# Patient Record
Sex: Female | Born: 1965
Health system: Southern US, Community
[De-identification: ages and names within clinical notes are randomized; demographics above are authoritative.]

## PROBLEM LIST (undated history)

## (undated) DIAGNOSIS — IMO0002 Reserved for concepts with insufficient information to code with codable children: Secondary | ICD-10-CM

## (undated) DIAGNOSIS — F419 Anxiety disorder, unspecified: Secondary | ICD-10-CM

## (undated) DIAGNOSIS — F17200 Nicotine dependence, unspecified, uncomplicated: Secondary | ICD-10-CM

## (undated) DIAGNOSIS — N6310 Unspecified lump in the right breast, unspecified quadrant: Secondary | ICD-10-CM

## (undated) DIAGNOSIS — R51 Headache: Secondary | ICD-10-CM

## (undated) DIAGNOSIS — A630 Anogenital (venereal) warts: Secondary | ICD-10-CM

## (undated) DIAGNOSIS — D229 Melanocytic nevi, unspecified: Secondary | ICD-10-CM

## (undated) DIAGNOSIS — K219 Gastro-esophageal reflux disease without esophagitis: Secondary | ICD-10-CM

## (undated) HISTORY — DX: Melanocytic nevi, unspecified: D22.9

## (undated) HISTORY — DX: Gastro-esophageal reflux disease without esophagitis: K21.9

## (undated) HISTORY — PX: TONSILLECTOMY: SUR1361

## (undated) HISTORY — DX: Reserved for concepts with insufficient information to code with codable children: IMO0002

## (undated) HISTORY — DX: Headache: R51

## (undated) HISTORY — DX: Anogenital (venereal) warts: A63.0

## (undated) HISTORY — PX: LASER ABLATION CONDYLOMA CERVICAL / VULVAR: SUR819

## (undated) HISTORY — PX: DIAGNOSTIC LAPAROSCOPY: SUR761

---

## 1996-01-18 HISTORY — PX: BREAST LUMPECTOMY: SHX2

## 1996-01-18 HISTORY — PX: BREAST EXCISIONAL BIOPSY: SUR124

## 1997-10-03 ENCOUNTER — Ambulatory Visit (HOSPITAL_COMMUNITY): Admission: RE | Admit: 1997-10-03 | Discharge: 1997-10-03 | Payer: Self-pay | Admitting: Obstetrics and Gynecology

## 1998-11-27 ENCOUNTER — Other Ambulatory Visit: Admission: RE | Admit: 1998-11-27 | Discharge: 1998-11-27 | Payer: Self-pay | Admitting: Obstetrics and Gynecology

## 1999-02-08 ENCOUNTER — Ambulatory Visit (HOSPITAL_COMMUNITY): Admission: RE | Admit: 1999-02-08 | Discharge: 1999-02-08 | Payer: Self-pay | Admitting: Obstetrics and Gynecology

## 1999-02-08 ENCOUNTER — Encounter: Payer: Self-pay | Admitting: Obstetrics and Gynecology

## 1999-02-26 ENCOUNTER — Encounter: Payer: Self-pay | Admitting: Obstetrics and Gynecology

## 1999-02-26 ENCOUNTER — Ambulatory Visit (HOSPITAL_COMMUNITY): Admission: RE | Admit: 1999-02-26 | Discharge: 1999-02-26 | Payer: Self-pay | Admitting: Obstetrics and Gynecology

## 1999-08-04 ENCOUNTER — Encounter: Payer: Self-pay | Admitting: Family Medicine

## 1999-08-04 ENCOUNTER — Encounter: Admission: RE | Admit: 1999-08-04 | Discharge: 1999-08-04 | Payer: Self-pay | Admitting: Family Medicine

## 1999-12-22 ENCOUNTER — Other Ambulatory Visit: Admission: RE | Admit: 1999-12-22 | Discharge: 1999-12-22 | Payer: Self-pay | Admitting: *Deleted

## 2000-01-19 ENCOUNTER — Encounter (INDEPENDENT_AMBULATORY_CARE_PROVIDER_SITE_OTHER): Payer: Self-pay

## 2000-01-19 ENCOUNTER — Ambulatory Visit (HOSPITAL_COMMUNITY): Admission: RE | Admit: 2000-01-19 | Discharge: 2000-01-19 | Payer: Self-pay | Admitting: Obstetrics and Gynecology

## 2000-02-07 ENCOUNTER — Encounter: Payer: Self-pay | Admitting: Family Medicine

## 2000-02-07 ENCOUNTER — Encounter: Admission: RE | Admit: 2000-02-07 | Discharge: 2000-02-07 | Payer: Self-pay | Admitting: Family Medicine

## 2000-02-07 ENCOUNTER — Other Ambulatory Visit: Admission: RE | Admit: 2000-02-07 | Discharge: 2000-02-07 | Payer: Self-pay | Admitting: Family Medicine

## 2000-12-25 ENCOUNTER — Other Ambulatory Visit: Admission: RE | Admit: 2000-12-25 | Discharge: 2000-12-25 | Payer: Self-pay | Admitting: Obstetrics and Gynecology

## 2001-05-14 ENCOUNTER — Encounter: Admission: RE | Admit: 2001-05-14 | Discharge: 2001-05-14 | Payer: Self-pay | Admitting: Family Medicine

## 2001-05-14 ENCOUNTER — Encounter: Payer: Self-pay | Admitting: Family Medicine

## 2002-01-01 ENCOUNTER — Other Ambulatory Visit: Admission: RE | Admit: 2002-01-01 | Discharge: 2002-01-01 | Payer: Self-pay | Admitting: Obstetrics and Gynecology

## 2002-03-08 ENCOUNTER — Encounter: Admission: RE | Admit: 2002-03-08 | Discharge: 2002-03-08 | Payer: Self-pay | Admitting: *Deleted

## 2002-03-08 ENCOUNTER — Encounter: Payer: Self-pay | Admitting: *Deleted

## 2002-03-28 ENCOUNTER — Encounter: Admission: RE | Admit: 2002-03-28 | Discharge: 2002-03-28 | Payer: Self-pay | Admitting: Family Medicine

## 2002-03-28 ENCOUNTER — Encounter: Payer: Self-pay | Admitting: Family Medicine

## 2004-03-16 ENCOUNTER — Encounter: Admission: RE | Admit: 2004-03-16 | Discharge: 2004-03-16 | Payer: Self-pay | Admitting: Obstetrics and Gynecology

## 2005-08-15 ENCOUNTER — Emergency Department (HOSPITAL_COMMUNITY): Admission: EM | Admit: 2005-08-15 | Discharge: 2005-08-15 | Payer: Self-pay | Admitting: Emergency Medicine

## 2005-11-24 ENCOUNTER — Encounter: Admission: RE | Admit: 2005-11-24 | Discharge: 2005-11-24 | Payer: Self-pay | Admitting: Family Medicine

## 2007-05-14 ENCOUNTER — Ambulatory Visit (HOSPITAL_COMMUNITY): Admission: RE | Admit: 2007-05-14 | Discharge: 2007-05-14 | Payer: Self-pay | Admitting: Obstetrics and Gynecology

## 2008-05-14 ENCOUNTER — Ambulatory Visit (HOSPITAL_COMMUNITY): Admission: RE | Admit: 2008-05-14 | Discharge: 2008-05-14 | Payer: Self-pay | Admitting: Obstetrics and Gynecology

## 2009-05-29 ENCOUNTER — Ambulatory Visit (HOSPITAL_COMMUNITY): Admission: RE | Admit: 2009-05-29 | Discharge: 2009-05-29 | Payer: Self-pay | Admitting: Obstetrics and Gynecology

## 2010-05-26 ENCOUNTER — Other Ambulatory Visit (HOSPITAL_COMMUNITY): Payer: Self-pay | Admitting: Obstetrics & Gynecology

## 2010-05-26 DIAGNOSIS — Z1231 Encounter for screening mammogram for malignant neoplasm of breast: Secondary | ICD-10-CM

## 2010-06-04 NOTE — Op Note (Signed)
[facility]  Patient:    Katelyn Hodges, Katelyn Hodges                      MRN: [id number] Proc. Date: 01/19/00 Adm. Date:  [id number] Attending:  Morene Antu                           Operative Report  PREOPERATIVE DIAGNOSIS:       1. Infertility.                               2. Irregular bleeding.  POSTOPERATIVE DIAGNOSIS:      1. Infertility.                               2. Irregular bleeding.                               3. Cornual obstruction with pelvic adhesions.  OPERATION:                    1. Dilatation and curettage hysteroscopy.                               2. Diagnostic laparoscopy with chromopertubation                                  and lysis of adhesions.  SURGEON:                      Sherry A. Rosalio Macadamia, M.D.  ANESTHESIA:                   General.  INDICATIONS:                   This is a 45 year old, G0, P0 woman who has had 12 years of infertility.  The patient has had irregular periods, lasting between 33 and 63 days, mostly with last menses greater than 50 days.  For the last four to five days, she has experienced brown discharge intermittently. The patient also has irregular bleeding between her menses.  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  Because of her infertility history, the patient is brought to the operating room for diagnostic laparoscopy.  Because of her irregular bleeding, A D&C hysteroscopy will be performed as well.  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  DESCRIPTION OF PROCEDURE:     The patient was brought into the operating room and given adequate general anesthesia.  She was placed in the dorsolithotomy position.  Her abdomen and perineum were washed with Betadine.  The vagina was washed with  Betadine.  Foley catheter was inserted into the bladder.  The patient was draped in a sterile fashion.  Speculum was placed within the vagina.  Cervix was grasped with a single-tooth tenaculum.  The cervix was sounded.  The cervix was dilated with Shawnie Pons dilators to a #21.  Hysteroscope was introduced into the endometrial cavity.  No abnormalities were found.  The hysteroscope was removed, and sampling sharp curettage was performed with no significant tissue present.  A HUMI catheter was then placed in the endometrial cavity.  The single-tooth tenaculum and speculum were removed from the vagina.  The surgeons gown and gloves were changed.  The patient was continued to be draped in a sterile fashion.  Subumbilical incision was made.  The fascia was identified.  It was grasped with Kocher clamps.  It was incised horizontally.  The edges of the incision were sutured with 0 Vicryl suture x 2.  The peritoneal cavity was then identified.  It was opened bluntly.  The Hasson trocar was introduced into the peritoneal cavity, and the sutures were placed on it for tension and to create an air block.  The laparoscope was then introduced into the abdominal cavity. Pictures were obtained from the pelvis.  A left suprapubic incision was made and, under direct visualization, suprapubic trocar was placed.  The pelvis was inspected with the above findings.  After pictures were obtained, YAG laser was introduced into the pelvis.  Using a scalpel tip at 15 watts power, all the adhesions around the left fallopian tube, ovary, and uterus were dissected free.  Some adhesions were taken off of the ovary and removed in  this fashion. A small hydatid of Morgagni was lasered off of the right fallopian tube. Prior to all the lasering, the uterus was infiltrated with indigo carmine dye mixed in normal saline.  With significant pressure, no filling of the fallopian tubes could be seen.  Venous system was visualized as increasing with indigo carmine dye.  The entire uterus became blanched and then blue with no filling beyond the cornus; therefore, diagnosis of cornual obstruction was made.  The surgery around the adhesions around the left ovary was performed top minimize left-sided pain as well as to improve chances for future IVF if patient so chooses.   Pictures were obtained of the appendix and the upper abdomen.  Once all pictures were finished, adequate hemostasis was present. There was no endometriosis seen, and no further surgery was performed.  All carbon dioxide was allowed to escape.  The Hasson trocar was removed from the abdominal cavity.  The left suprapubic trocar was removed after all carbon dioxide had escaped.  The subumbilical incision was then closed with the 0 Vicryl stay sutures that had been placed.  This was closed with figure-of-eight interrupted stitches.  Adequate hemostasis was present.  The subumbilical incision was closed with a 4-0 Vicryl in subcutaneous interrupted stitches.  Skin incisions were closed with Dermabond glue.  The HUMI was removed from the cervix and uterus.  The Foley catheter was removed from the bladder.  The patient was taken out of the dorsolithotomy position.  She was awakened; she was extubated; she was removed from the operating room to a stretcher in stable condition.  Complications were none.  Estimated blood loss less than 5 cc. DD:  01/19/00 TD:  01/19/00 Job: [id number]

## 2010-06-10 ENCOUNTER — Encounter: Payer: Self-pay | Admitting: Internal Medicine

## 2010-06-10 ENCOUNTER — Ambulatory Visit (INDEPENDENT_AMBULATORY_CARE_PROVIDER_SITE_OTHER): Payer: Self-pay | Admitting: Internal Medicine

## 2010-06-10 DIAGNOSIS — R209 Unspecified disturbances of skin sensation: Secondary | ICD-10-CM

## 2010-06-10 DIAGNOSIS — F17200 Nicotine dependence, unspecified, uncomplicated: Secondary | ICD-10-CM

## 2010-06-10 DIAGNOSIS — R202 Paresthesia of skin: Secondary | ICD-10-CM

## 2010-06-10 DIAGNOSIS — K219 Gastro-esophageal reflux disease without esophagitis: Secondary | ICD-10-CM

## 2010-06-10 DIAGNOSIS — R946 Abnormal results of thyroid function studies: Secondary | ICD-10-CM

## 2010-06-10 DIAGNOSIS — E162 Hypoglycemia, unspecified: Secondary | ICD-10-CM

## 2010-06-10 DIAGNOSIS — E161 Other hypoglycemia: Secondary | ICD-10-CM

## 2010-06-10 MED ORDER — OMEPRAZOLE 40 MG PO CPDR: 40.0000 mg | DELAYED_RELEASE_CAPSULE | Freq: Every day | ORAL | Status: DC

## 2010-06-15 ENCOUNTER — Ambulatory Visit (HOSPITAL_COMMUNITY): Payer: Self-pay

## 2010-06-16 ENCOUNTER — Ambulatory Visit (HOSPITAL_COMMUNITY)
Admission: RE | Admit: 2010-06-16 | Discharge: 2010-06-16 | Disposition: A | Discharge status: Home / Self Care | Payer: Self-pay | Source: Ambulatory Visit | Attending: Obstetrics & Gynecology | Admitting: Obstetrics & Gynecology

## 2010-06-16 ENCOUNTER — Encounter: Payer: Self-pay | Admitting: Physician Assistant

## 2010-06-16 DIAGNOSIS — Z1231 Encounter for screening mammogram for malignant neoplasm of breast: Secondary | ICD-10-CM

## 2010-06-20 ENCOUNTER — Encounter: Payer: Self-pay | Admitting: Internal Medicine

## 2010-06-20 DIAGNOSIS — K219 Gastro-esophageal reflux disease without esophagitis: Secondary | ICD-10-CM | POA: Insufficient documentation

## 2010-06-20 DIAGNOSIS — F17200 Nicotine dependence, unspecified, uncomplicated: Secondary | ICD-10-CM | POA: Insufficient documentation

## 2010-06-20 DIAGNOSIS — R7989 Other specified abnormal findings of blood chemistry: Secondary | ICD-10-CM | POA: Insufficient documentation

## 2010-06-20 DIAGNOSIS — E161 Other hypoglycemia: Secondary | ICD-10-CM | POA: Insufficient documentation

## 2010-06-20 DIAGNOSIS — R202 Paresthesia of skin: Secondary | ICD-10-CM | POA: Insufficient documentation

## 2010-06-20 NOTE — Assessment & Plan Note (Signed)
Consolation of symptoms not entirely consistent with hyperthyroidism. T4 normal with a low TSH. Recommend repeating TSH and free T4 in approximately 3 weeks. May represent subclinical hyperthyroidism

## 2010-06-20 NOTE — Assessment & Plan Note (Signed)
Obtain B12 level

## 2010-06-20 NOTE — Assessment & Plan Note (Signed)
Discussed pathophysiology involved. Decrease sugar and carbohydrate intake and increase protein intake. No current evidence for diabetes mellitus

## 2010-06-20 NOTE — Assessment & Plan Note (Signed)
Begin omeprazole 40 mg daily.Followup if no improvement or worsening.

## 2010-06-20 NOTE — Progress Notes (Signed)
  Subjective:    Patient ID: Katelyn Hodges, female    DOB: 10-10-1965, 45 y.o.   MRN: [id number]  HPI patient presents to clinic to establish care and for evaluation of multiple medical problems. Has intermittent dizziness recently improved. Notes improvement with p.o. Intake. Blood sugars have been normal. Atypical dull persistent chest discomfort also sharp chest discomfort lasting seconds only associated with belching. Took Zantac with increasing belching. Recently presented to the urgent care and states declined hospitalization for chest pain. Was provided Xanax prescription which she did not take. Has complaints of bilateral leg tingling below the level of the knee. No back pain or radicular symptoms. Brings in outside labs for review including CBC Chem-7 liver function tests fasting lipid profile ALT normal. TSH low at 0.286 however T4 and T3 uptake normal. Smokes tobacco and regular basis but is attempting Chantix samples.  Reviewed past medical history, past surgical history, medications, allergies, social history and family history    Review of Systems  Constitutional: Negative for fever, chills, fatigue and unexpected weight change.  Respiratory: Positive for chest tightness. Negative for cough and shortness of breath.   Cardiovascular: Positive for chest pain. Negative for palpitations.  Gastrointestinal: Negative for abdominal pain and blood in stool.  Neurological: Positive for dizziness and numbness. Negative for weakness.  [all other systems reviewed and are negative        Objective:   Physical Exam    Physical Exam  Vitals reviewed. Constitutional:  appears well-developed and well-nourished. No distress.  HENT:  Head: Normocephalic and atraumatic.  Right Ear: Tympanic membrane, external ear and ear canal normal.  Left Ear: Tympanic membrane, external ear and ear canal normal.  Nose: Nose normal.  Mouth/Throat: Oropharynx is clear and moist. No oropharyngeal exudate.    Eyes: Conjunctivae and EOM are normal. Pupils are equal, round, and reactive to light. Right eye exhibits no discharge. Left eye exhibits no discharge. No scleral icterus.  Neck: Neck supple. No thyromegaly present.  Cardiovascular: Normal rate, regular rhythm and normal heart sounds.  Exam reveals no gallop and no friction rub.   No murmur heard. Pulmonary/Chest: Effort normal and breath sounds normal. No respiratory distress.  has no wheezes.  has no rales.  Lymphadenopathy:   no cervical adenopathy.  Neurological:  is alert.  Skin: Skin is warm and dry.  not diaphoretic.  Psychiatric: normal mood and affect.      Assessment & Plan:

## 2010-06-20 NOTE — Assessment & Plan Note (Signed)
Counseled regarding the need for cessation. Patient proceeding with Chantix samples

## 2010-06-30 ENCOUNTER — Other Ambulatory Visit (INDEPENDENT_AMBULATORY_CARE_PROVIDER_SITE_OTHER): Payer: Self-pay

## 2010-06-30 DIAGNOSIS — R209 Unspecified disturbances of skin sensation: Secondary | ICD-10-CM

## 2010-06-30 DIAGNOSIS — R202 Paresthesia of skin: Secondary | ICD-10-CM

## 2010-06-30 DIAGNOSIS — R946 Abnormal results of thyroid function studies: Secondary | ICD-10-CM

## 2010-07-02 ENCOUNTER — Telehealth: Payer: Self-pay

## 2010-07-02 NOTE — Telephone Encounter (Signed)
Message copied by Beverely Low on Fri Jul 02, 2010  5:19 PM ------      Message from: Staci Righter      Created: Fri Jul 02, 2010  5:13 PM       Both thyroid tests nl. B12 low nl. Begin b12 po qd

## 2010-07-02 NOTE — Telephone Encounter (Signed)
Pt notified and verbalized understanding.  Pt wants to know if there is any explanation as to why her thyroid tested 0.02 three weeks ago and she was feeling horrible but tests nl now. Please advise

## 2010-07-04 NOTE — Telephone Encounter (Signed)
As discussed during visit- initial thyroid screening tests may appear abn especially during times of illness. Appropriate path is to repeat with direct measurement as well which was done. Both tests nl.

## 2010-07-05 NOTE — Telephone Encounter (Signed)
Pt.notified

## 2010-07-05 NOTE — Telephone Encounter (Signed)
Left another message for pt to call back.

## 2010-07-05 NOTE — Telephone Encounter (Signed)
Left message for pt to call back  °

## 2010-07-08 ENCOUNTER — Other Ambulatory Visit: Payer: Self-pay

## 2010-07-09 ENCOUNTER — Telehealth: Payer: Self-pay | Admitting: Internal Medicine

## 2010-07-09 NOTE — Telephone Encounter (Signed)
Pt called to check on status of getting copy of her lab results. Pt said that she had req them to be mailed to her home address. Pls mail.

## 2010-07-09 NOTE — Telephone Encounter (Signed)
done

## 2010-11-17 ENCOUNTER — Ambulatory Visit: Payer: Self-pay | Admitting: Internal Medicine

## 2011-05-19 ENCOUNTER — Other Ambulatory Visit (HOSPITAL_COMMUNITY): Payer: Self-pay | Admitting: Obstetrics and Gynecology

## 2011-05-19 DIAGNOSIS — Z1231 Encounter for screening mammogram for malignant neoplasm of breast: Secondary | ICD-10-CM

## 2011-06-20 ENCOUNTER — Ambulatory Visit (HOSPITAL_COMMUNITY)
Admission: RE | Admit: 2011-06-20 | Discharge: 2011-06-20 | Disposition: A | Discharge status: Home / Self Care | Payer: BC Managed Care – PPO | Source: Ambulatory Visit | Attending: Obstetrics and Gynecology | Admitting: Obstetrics and Gynecology

## 2011-06-20 DIAGNOSIS — Z1231 Encounter for screening mammogram for malignant neoplasm of breast: Secondary | ICD-10-CM

## 2011-06-24 ENCOUNTER — Other Ambulatory Visit: Payer: Self-pay | Admitting: Surgery

## 2011-06-27 ENCOUNTER — Other Ambulatory Visit: Payer: Self-pay | Admitting: Obstetrics and Gynecology

## 2011-06-27 DIAGNOSIS — R928 Other abnormal and inconclusive findings on diagnostic imaging of breast: Secondary | ICD-10-CM

## 2011-06-30 ENCOUNTER — Ambulatory Visit
Admission: RE | Admit: 2011-06-30 | Discharge: 2011-06-30 | Disposition: A | Discharge status: Home / Self Care | Payer: BC Managed Care – PPO | Source: Ambulatory Visit | Attending: Obstetrics and Gynecology | Admitting: Obstetrics and Gynecology

## 2011-06-30 ENCOUNTER — Other Ambulatory Visit: Payer: Self-pay | Admitting: Obstetrics and Gynecology

## 2011-06-30 DIAGNOSIS — R928 Other abnormal and inconclusive findings on diagnostic imaging of breast: Secondary | ICD-10-CM

## 2012-05-22 ENCOUNTER — Other Ambulatory Visit: Payer: Self-pay

## 2012-05-22 ENCOUNTER — Other Ambulatory Visit: Payer: Self-pay | Admitting: Obstetrics and Gynecology

## 2012-05-22 DIAGNOSIS — Z1231 Encounter for screening mammogram for malignant neoplasm of breast: Secondary | ICD-10-CM

## 2012-06-27 ENCOUNTER — Ambulatory Visit: Payer: BC Managed Care – PPO

## 2012-06-28 ENCOUNTER — Ambulatory Visit
Admission: RE | Admit: 2012-06-28 | Discharge: 2012-06-28 | Disposition: A | Discharge status: Home / Self Care | Payer: BC Managed Care – PPO | Source: Ambulatory Visit

## 2012-06-28 DIAGNOSIS — Z1231 Encounter for screening mammogram for malignant neoplasm of breast: Secondary | ICD-10-CM

## 2012-11-30 ENCOUNTER — Other Ambulatory Visit: Payer: Self-pay | Admitting: Obstetrics & Gynecology

## 2012-11-30 ENCOUNTER — Other Ambulatory Visit (HOSPITAL_COMMUNITY)
Admission: RE | Admit: 2012-11-30 | Discharge: 2012-11-30 | Disposition: A | Discharge status: Home / Self Care | Payer: BC Managed Care – PPO | Source: Ambulatory Visit | Attending: Obstetrics & Gynecology | Admitting: Obstetrics & Gynecology

## 2012-11-30 DIAGNOSIS — Z01419 Encounter for gynecological examination (general) (routine) without abnormal findings: Secondary | ICD-10-CM | POA: Insufficient documentation

## 2012-11-30 DIAGNOSIS — Z1151 Encounter for screening for human papillomavirus (HPV): Secondary | ICD-10-CM | POA: Insufficient documentation

## 2013-01-02 ENCOUNTER — Inpatient Hospital Stay (HOSPITAL_COMMUNITY)
Admission: AD | Admit: 2013-01-02 | Discharge: 2013-01-02 | Disposition: A | Discharge status: Home / Self Care | Payer: BC Managed Care – PPO | Source: Ambulatory Visit | Attending: Obstetrics & Gynecology | Admitting: Obstetrics & Gynecology

## 2013-01-02 ENCOUNTER — Encounter (HOSPITAL_COMMUNITY): Payer: Self-pay | Admitting: General Practice

## 2013-01-02 DIAGNOSIS — F17200 Nicotine dependence, unspecified, uncomplicated: Secondary | ICD-10-CM | POA: Insufficient documentation

## 2013-01-02 DIAGNOSIS — N926 Irregular menstruation, unspecified: Secondary | ICD-10-CM

## 2013-01-02 DIAGNOSIS — N939 Abnormal uterine and vaginal bleeding, unspecified: Secondary | ICD-10-CM

## 2013-01-02 DIAGNOSIS — N949 Unspecified condition associated with female genital organs and menstrual cycle: Secondary | ICD-10-CM | POA: Insufficient documentation

## 2013-01-02 DIAGNOSIS — R109 Unspecified abdominal pain: Secondary | ICD-10-CM | POA: Insufficient documentation

## 2013-01-02 DIAGNOSIS — N938 Other specified abnormal uterine and vaginal bleeding: Secondary | ICD-10-CM | POA: Insufficient documentation

## 2013-01-02 LAB — POCT PREGNANCY, URINE: Preg Test, Ur: NEGATIVE

## 2013-01-02 LAB — URINALYSIS, ROUTINE W REFLEX MICROSCOPIC
Bilirubin Urine: NEGATIVE
Nitrite: NEGATIVE
Specific Gravity, Urine: 1.01 (ref 1.005–1.030)
Urobilinogen, UA: 0.2 mg/dL (ref 0.0–1.0)

## 2013-01-02 LAB — CBC WITH DIFFERENTIAL/PLATELET
Basophils Relative: 1 % (ref 0–1)
Eosinophils Relative: 4 % (ref 0–5)
HCT: 36.6 % (ref 36.0–46.0)
Hemoglobin: 12.6 g/dL (ref 12.0–15.0)
Lymphs Abs: 2.3 10*3/uL (ref 0.7–4.0)
MCV: 87.4 fL (ref 78.0–100.0)
Monocytes Absolute: 0.5 10*3/uL (ref 0.1–1.0)
Monocytes Relative: 8 % (ref 3–12)
Neutro Abs: 3.1 10*3/uL (ref 1.7–7.7)
Neutrophils Relative %: 50 % (ref 43–77)
Platelets: 306 10*3/uL (ref 150–400)
RBC: 4.19 MIL/uL (ref 3.87–5.11)
RDW: 13.2 % (ref 11.5–15.5)

## 2013-01-02 LAB — URINE MICROSCOPIC-ADD ON

## 2013-01-02 NOTE — MAU Note (Signed)
Patient states she started her period on 12-14. States she usually has a constant heavy flow for about 2 days, today became heavier with blood clots. Having a constant lower abdominal pain when the heavy bleeding started.

## 2013-01-02 NOTE — MAU Provider Note (Signed)
History     CSN: [id number]  Arrival date and time: 01/02/13 1620   First Provider Initiated Contact with Patient 01/02/13 1656      Chief Complaint  Patient presents with  . Vaginal Bleeding  . Abdominal Cramping   HPI Katelyn Hodges is 47 y.o. presents with excessive vaginal bleeding. She used 3 super tampons in 40 minutes.  LMP 12/14. She called Katelyn Hodges today at 3 pm and instructed to come to MAU.   Increase bleeding this afternoon at work with clots associated with lower abdominal pain.  She wasn't sure if she should pick up the medication that she and Katelyn Hodges had discussed.  She has had endometrial bx that she said was neg.  Ultrasound was planned next.   She appears stable at this time.      Past Medical History  Diagnosis Date  . GERD (gastroesophageal reflux disease)   . Ulcer   . Glaucoma   . Genital warts   . [id number](478.2)     Past Surgical History  Procedure Laterality Date  . Tonsillectomy      Family History  Problem Relation Age of Onset  . Cancer Mother     breast  . Heart disease Father   . Hyperlipidemia Father   . Nephritis Sister   . Heart disease Maternal Uncle   . Cancer Maternal Uncle   . Hyperlipidemia Maternal Uncle   . Hypertension Maternal Uncle   . Heart disease Paternal Uncle   . Diabetes Maternal Grandmother   . Heart disease Maternal Grandfather   . Cancer Maternal Grandfather   . Hyperlipidemia Maternal Grandfather   . Hypertension Maternal Grandfather   . Heart disease Paternal Grandfather   . Cancer Paternal Grandfather     History  Substance Use Topics  . Smoking status: Current Every Day Smoker -- 1.00 packs/day    Types: Cigarettes  . Smokeless tobacco: Not on file  . Alcohol Use: Yes    Allergies: No Known Allergies  Prescriptions prior to admission  Medication Sig Dispense Refill  . ibuprofen (ADVIL,MOTRIN) 600 MG tablet Take 600 mg by mouth every 6 (six) hours as needed for cramping.      . Multiple Vitamin  (MULTIVITAMIN WITH MINERALS) TABS tablet Take 1 tablet by mouth daily.      . vitamin B-12 (CYANOCOBALAMIN) 1000 MCG tablet Take 1,000 mcg by mouth daily.        Review of Systems  Constitutional: Negative for fever and chills.  Gastrointestinal: Positive for abdominal pain (intermittent pain). Negative for nausea, vomiting, diarrhea and constipation.  Genitourinary: Negative for dysuria, urgency, frequency and hematuria.       + for vaginal bleeding with clots  Neurological: Negative for headaches.   Physical Exam   Blood pressure 143/80, pulse 89, resp. rate 16, height 5\' 7"  (1.702 m), weight 145 lb 9.6 oz (66.044 kg), last menstrual period 12/30/2012, SpO2 100.00%.  Physical Exam  Constitutional: She is oriented to person, place, and time. She appears well-developed and well-nourished. No distress.  HENT:  Head: Normocephalic.  Neck: Normal range of motion.  Cardiovascular: Normal rate.   Respiratory: Effort normal.  GI: Soft. She exhibits no distension and no mass. There is no tenderness. There is no rebound and no guarding.  Genitourinary: There is no rash, tenderness or lesion on the right labia. There is no rash, tenderness or lesion on the left labia. There is bleeding (small amount of bright red blood with 1 moderate size  clot.. Neg for active bleeding) around the vagina. No tenderness around the vagina. No foreign body around the vagina. No vaginal discharge found.  Neurological: She is alert and oriented to person, place, and time.  Skin: Skin is warm and dry.  Psychiatric: She has a normal mood and affect. Her behavior is normal.   Results for orders placed during the hospital encounter of 01/02/13 (from the past 24 hour(s))  URINALYSIS, ROUTINE W REFLEX MICROSCOPIC     Status: Abnormal   Collection Time    01/02/13  4:48 PM      Result Value Range   Color, Urine YELLOW  YELLOW   APPearance CLEAR  CLEAR   Specific Gravity, Urine 1.010  1.005 - 1.030   pH 5.5  5.0 - 8.0    Glucose, UA NEGATIVE  NEGATIVE mg/dL   Hgb urine dipstick LARGE (*) NEGATIVE   Bilirubin Urine NEGATIVE  NEGATIVE   Ketones, ur NEGATIVE  NEGATIVE mg/dL   Protein, ur NEGATIVE  NEGATIVE mg/dL   Urobilinogen, UA 0.2  0.0 - 1.0 mg/dL   Nitrite NEGATIVE  NEGATIVE   Leukocytes, UA NEGATIVE  NEGATIVE  URINE MICROSCOPIC-ADD ON     Status: None   Collection Time    01/02/13  4:48 PM      Result Value Range   Squamous Epithelial / LPF RARE  RARE   RBC / HPF 0-2  <3 RBC/hpf  POCT PREGNANCY, URINE     Status: None   Collection Time    01/02/13  5:33 PM      Result Value Range   Preg Test, Ur NEGATIVE  NEGATIVE    MAU Course  Procedures  MDM Reported MSE to Katelyn Hodges.  Order given for HGB and pelvic exam.  Instructed to discuss ultrasound with patient--not emergent and can be done in the office.  Discussed with the patient the option of Provera or the Listeda that is at her pharmacy.  She would like to pick the Rx up and begin taking it instead of using a "new: medication".    Care turned over to [name], Katelyn Hodges at 18:10. 1640 Hbg 12.6 Dr Charlotta Hodges notified Assessment and Plan  A:  Heavy vaginal bleeding    P:  Patient will pick Rx up at the pharmacy tonight and begin taking as RX      Follow up with Katelyn Hodges 01/02/2013, 6:08 PM

## 2013-07-22 ENCOUNTER — Other Ambulatory Visit: Payer: Self-pay

## 2013-07-22 DIAGNOSIS — Z1231 Encounter for screening mammogram for malignant neoplasm of breast: Secondary | ICD-10-CM

## 2013-08-23 ENCOUNTER — Ambulatory Visit
Admission: RE | Admit: 2013-08-23 | Discharge: 2013-08-23 | Disposition: A | Discharge status: Home / Self Care | Payer: BC Managed Care – PPO | Source: Ambulatory Visit

## 2013-08-23 DIAGNOSIS — Z1231 Encounter for screening mammogram for malignant neoplasm of breast: Secondary | ICD-10-CM

## 2013-11-11 ENCOUNTER — Other Ambulatory Visit: Payer: Self-pay | Admitting: Obstetrics & Gynecology

## 2013-11-13 LAB — CYTOLOGY - PAP

## 2013-12-05 ENCOUNTER — Encounter (HOSPITAL_COMMUNITY): Payer: Self-pay | Admitting: *Deleted

## 2013-12-05 ENCOUNTER — Emergency Department (HOSPITAL_COMMUNITY)
Admission: EM | Admit: 2013-12-05 | Discharge: 2013-12-05 | Disposition: A | Discharge status: Home / Self Care | Payer: BC Managed Care – PPO | Source: Home / Self Care | Attending: Emergency Medicine | Admitting: Emergency Medicine

## 2013-12-05 DIAGNOSIS — A049 Bacterial intestinal infection, unspecified: Secondary | ICD-10-CM

## 2013-12-05 LAB — POCT I-STAT, CHEM 8
BUN: 7 mg/dL (ref 6–23)
CALCIUM ION: 1.18 mmol/L (ref 1.12–1.23)
CREATININE: 0.7 mg/dL (ref 0.50–1.10)
Chloride: 107 mEq/L (ref 96–112)
Glucose, Bld: 115 mg/dL — ABNORMAL HIGH (ref 70–99)
HCT: 51 % — ABNORMAL HIGH (ref 36.0–46.0)
Hemoglobin: 17.3 g/dL — ABNORMAL HIGH (ref 12.0–15.0)
Potassium: 3.9 mEq/L (ref 3.7–5.3)
Sodium: 139 mEq/L (ref 137–147)
TCO2: 20 mmol/L (ref 0–100)

## 2013-12-05 LAB — CBC WITH DIFFERENTIAL/PLATELET
BASOS PCT: 0 % (ref 0–1)
Basophils Absolute: 0 10*3/uL (ref 0.0–0.1)
Eosinophils Absolute: 0.3 10*3/uL (ref 0.0–0.7)
Eosinophils Relative: 2 % (ref 0–5)
HEMATOCRIT: 45.7 % (ref 36.0–46.0)
HEMOGLOBIN: 15.9 g/dL — AB (ref 12.0–15.0)
LYMPHS ABS: 2.2 10*3/uL (ref 0.7–4.0)
Lymphocytes Relative: 17 % (ref 12–46)
MCH: 30.5 pg (ref 26.0–34.0)
MCHC: 34.8 g/dL (ref 30.0–36.0)
MCV: 87.7 fL (ref 78.0–100.0)
MONO ABS: 0.9 10*3/uL (ref 0.1–1.0)
MONOS PCT: 7 % (ref 3–12)
NEUTROS ABS: 9.6 10*3/uL — AB (ref 1.7–7.7)
Neutrophils Relative %: 74 % (ref 43–77)
Platelets: 312 10*3/uL (ref 150–400)
RBC: 5.21 MIL/uL — ABNORMAL HIGH (ref 3.87–5.11)
RDW: 13.3 % (ref 11.5–15.5)
WBC: 13 10*3/uL — ABNORMAL HIGH (ref 4.0–10.5)

## 2013-12-05 MED ORDER — SODIUM CHLORIDE 0.9 % IV BOLUS (SEPSIS)
1000.0000 mL | Freq: Once | INTRAVENOUS | Status: AC
  Administered 2013-12-05: 1000 mL via INTRAVENOUS

## 2013-12-05 MED ORDER — CIPROFLOXACIN HCL 500 MG PO TABS: 500.0000 mg | ORAL_TABLET | Freq: Once | ORAL | Status: DC

## 2013-12-05 NOTE — ED Notes (Signed)
Patient stated that she was unable to provide a urine specimen. She voided prior to obtaining stool specimen

## 2013-12-05 NOTE — ED Notes (Signed)
Pt  States  She  Ate  A  Rare  Steak   sev  Days  Ago  And  Has experienced      Diarrhea    And  Low  abd cramps  - had  Some  sweating  As   Well  Last  Pm    .      Pt  denys   Any   Nausea  /  Vomiting

## 2013-12-05 NOTE — ED Provider Notes (Signed)
CSN: [id number]     Arrival date & time 12/05/13  3832 History   First MD Initiated Contact with Patient 12/05/13 0827     Chief Complaint  Patient presents with  . Abdominal Cramping   (Consider location/radiation/quality/duration/timing/severity/associated sxs/prior Treatment) HPI         48 year old female presents for evaluation of bloody diarrhea. This initially began 2 days ago, about 2 hours after she ate steak dinner. She notes that the steak was cooked rare and red meat usually gives her diarrhea but other than that, there was nothing abnormal about the meal. 2 hours after the meal she began to have grossly bloody diarrhea. This is been profuse, occurring about every hour. It has slowed down some in the past day but she is still having the grossly bloody diarrhea. She has had some mild associated dizziness. She also admits to some mild nausea without vomiting. She also gets some abdominal cramping that is mild. She denies fever, chills, vomiting. No recent travel or sick contacts. No recent antibiotic use. No history of bloody diarrhea. No history of C. difficile infection. 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  Past Medical History  Diagnosis Date  . GERD (gastroesophageal reflux disease)   . Ulcer   . Glaucoma   . Genital warts   . 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 Tonsillectomy     Family History  Problem Relation Age of Onset  . Cancer Mother     breast  . Heart disease Father   . Hyperlipidemia Father   . Nephritis Sister   . Heart disease Maternal Uncle   . Cancer Maternal Uncle   . Hyperlipidemia Maternal Uncle   . Hypertension Maternal Uncle   . Heart disease Paternal Uncle   . Diabetes Maternal Grandmother   . Heart disease Maternal Grandfather   . Cancer Maternal Grandfather   . Hyperlipidemia Maternal Grandfather   . Hypertension Maternal Grandfather   . Heart disease Paternal Grandfather   . Cancer  Paternal Grandfather    History  Substance Use Topics  . Smoking status: Current Every Day Smoker -- 1.00 packs/day    Types: Cigarettes  . Smokeless tobacco: Not on file  . Alcohol Use: Yes   OB History    No data available     Review of Systems  Gastrointestinal: Positive for nausea, diarrhea and blood in stool. Negative for abdominal pain.  All other systems reviewed and are negative.   Allergies  Review of patient's allergies indicates no known allergies.  Home Medications   Prior to Admission medications   Medication Sig Start Date End Date Taking? Authorizing Provider  ibuprofen (ADVIL,MOTRIN) 600 MG tablet Take 600 mg by mouth every 6 (six) hours as needed for cramping.    Historical Provider, MD  Multiple Vitamin (MULTIVITAMIN WITH MINERALS) TABS tablet Take 1 tablet by mouth daily.    Historical Provider, MD  vitamin B-12 (CYANOCOBALAMIN) 1000 MCG tablet Take 1,000 mcg by mouth daily.    Historical Provider, MD   BP 136/100 mmHg  Temp(Src) 98.6 F (37 C) (Oral)  Resp 20  SpO2 100%  LMP 11/22/2013 Physical Exam  Constitutional: She is oriented to person, place, and time. Vital signs are normal. She appears well-developed and well-nourished. No distress.  HENT:  Head: Normocephalic and atraumatic.  Eyes:  No scleral pallor  Cardiovascular: Regular rhythm, normal heart sounds and normal pulses.  Tachycardia present.  Pulmonary/Chest: Effort normal and breath sounds normal. No respiratory distress.  Abdominal: Soft. Normal appearance. She exhibits no distension, no ascites and no mass. Bowel sounds are decreased. There is no hepatosplenomegaly. There is tenderness (mild) in the left lower quadrant. There is no rigidity, no rebound, no guarding, no CVA tenderness, no tenderness at McBurney's point and negative Murphy's sign. No hernia.  Neurological: She is alert and oriented to person, place, and time. She has normal strength. Coordination normal.  Skin: Skin is  warm and dry. No rash noted. She is not diaphoretic.  Psychiatric: She has a normal mood and affect. Judgment normal.  Nursing note and vitals reviewed.   ED Course  Procedures (including critical care time) Labs Review Labs Reviewed  CBC WITH DIFFERENTIAL - Abnormal; Notable for the following:    WBC 13.0 (*)    RBC 5.21 (*)    Hemoglobin 15.9 (*)    Neutro Abs 9.6 (*)    All other components within normal limits  POCT I-STAT, CHEM 8 - Abnormal; Notable for the following:    Glucose, Bld 115 (*)    Hemoglobin 17.3 (*)    HCT 51.0 (*)    All other components within normal limits  CLOSTRIDIUM DIFFICILE BY PCR  STOOL CULTURE    Imaging Review No results found.  She attempted to provide a stool sample, she was not able to provide enough to send for a culture but what she did provide was grossly bloody  MDM   1. Intestinal infection due to bacteria causing bloody diarrhea    After 1 L of normal saline, the heart rate is down to 80 and she is feeling much better. Her CBC reveals mild leukocytosis, no anemia. She has no renal dysfunction and her electrolytes are all within normal limits. With grossly bloody diarrhea, the presumptive diagnosis is enterohemorrhagic Escherichia coli infection. She has been provided a stool sample kit to bring back so that we may send a stool culture and C. difficile toxin. The plan is for her to return on Saturday for a recheck, to recheck her vitals as well as CBC and i-STAT again. If she worsens in the meantime, she will return for a recheck. No antibiotics are indicated at this time, pending the stool culture results      Liam Graham, PA-C 12/05/13 1009

## 2013-12-05 NOTE — Discharge Instructions (Signed)
Please return the stool sample as soon as possible so that we may send the culture and the C. difficile toxin. Follow up on Saturday morning to be checked again.   Food Choices to Help Relieve Diarrhea When you have diarrhea, the foods you eat and your eating habits are very important. Choosing the right foods and drinks can help relieve diarrhea. Also, because diarrhea can last up to 7 days, you need to replace lost fluids and electrolytes (such as sodium, potassium, and chloride) in order to help prevent dehydration.  WHAT GENERAL GUIDELINES DO I NEED TO FOLLOW?  Slowly drink 1 cup (8 oz) of fluid for each episode of diarrhea. If you are getting enough fluid, your urine will be clear or pale yellow.  Eat starchy foods. Some good choices include white rice, white toast, pasta, low-fiber cereal, baked potatoes (without the skin), saltine crackers, and bagels.  Avoid large servings of any cooked vegetables.  Limit fruit to two servings per day. A serving is  cup or 1 small piece.  Choose foods with less than 2 g of fiber per serving.  Limit fats to less than 8 tsp (38 g) per day.  Avoid fried foods.  Eat foods that have probiotics in them. Probiotics can be found in certain dairy products.  Avoid foods and beverages that may increase the speed at which food moves through the stomach and intestines (gastrointestinal tract). Things to avoid include:  High-fiber foods, such as dried fruit, raw fruits and vegetables, nuts, seeds, and whole grain foods.  Spicy foods and high-fat foods.  Foods and beverages sweetened with high-fructose corn syrup, honey, or sugar alcohols such as xylitol, sorbitol, and mannitol. WHAT FOODS ARE RECOMMENDED? Grains White rice. White, [place], or pita breads (fresh or toasted), including plain rolls, buns, or bagels. White pasta. Saltine, soda, or graham crackers. Pretzels. Low-fiber cereal. Cooked cereals made with water (such as cornmeal, farina, or cream  cereals). Plain muffins. Matzo. Melba toast. Zwieback.  Vegetables Potatoes (without the skin). Strained tomato and vegetable juices. Most well-cooked and canned vegetables without seeds. Tender lettuce. Fruits Cooked or canned applesauce, apricots, cherries, fruit cocktail, grapefruit, peaches, pears, or plums. Fresh bananas, apples without skin, cherries, grapes, cantaloupe, grapefruit, peaches, oranges, or plums.  Meat and Other Protein Products Baked or boiled chicken. Eggs. Tofu. Fish. Seafood. Smooth peanut butter. Ground or well-cooked tender beef, ham, veal, lamb, pork, or poultry.  Dairy Plain yogurt, kefir, and unsweetened liquid yogurt. Lactose-free milk, buttermilk, or soy milk. Plain hard cheese. Beverages Sport drinks. Clear broths. Diluted fruit juices (except prune). Regular, caffeine-free sodas such as ginger ale. Water. Decaffeinated teas. Oral rehydration solutions. Sugar-free beverages not sweetened with sugar alcohols. Other Bouillon, broth, or soups made from recommended foods.  The items listed above may not be a complete list of recommended foods or beverages. Contact your dietitian for more options. WHAT FOODS ARE NOT RECOMMENDED? Grains Whole grain, whole wheat, bran, or rye breads, rolls, pastas, crackers, and cereals. Wild or brown rice. Cereals that contain more than 2 g of fiber per serving. Corn tortillas or taco shells. Cooked or dry oatmeal. Granola. Popcorn. Vegetables Raw vegetables. Cabbage, broccoli, Brussels sprouts, artichokes, baked beans, beet greens, corn, kale, legumes, peas, sweet potatoes, and yams. Potato skins. Cooked spinach and cabbage. Fruits Dried fruit, including raisins and dates. Raw fruits. Stewed or dried prunes. Fresh apples with skin, apricots, mangoes, pears, raspberries, and strawberries.  Meat and Other Protein Products Chunky peanut butter. Nuts and seeds. Beans  and lentils. Berniece Salines.  Dairy High-fat cheeses. Milk, chocolate milk, and  beverages made with milk, such as milk shakes. Cream. Ice cream. Sweets and Desserts Sweet rolls, doughnuts, and sweet breads. Pancakes and waffles. Fats and Oils Butter. Cream sauces. Margarine. Salad oils. Plain salad dressings. Olives. Avocados.  Beverages Caffeinated beverages (such as coffee, tea, soda, or energy drinks). Alcoholic beverages. Fruit juices with pulp. Prune juice. Soft drinks sweetened with high-fructose corn syrup or sugar alcohols. Other Coconut. Hot sauce. Chili powder. Mayonnaise. Gravy. Cream-based or milk-based soups.  The items listed above may not be a complete list of foods and beverages to avoid. Contact your dietitian for more information. WHAT SHOULD I DO IF I BECOME DEHYDRATED? Diarrhea can sometimes lead to dehydration. Signs of dehydration include dark urine and dry mouth and skin. If you think you are dehydrated, you should rehydrate with an oral rehydration solution. These solutions can be purchased at pharmacies, retail stores, or online.  Drink -1 cup (120-240 mL) of oral rehydration solution each time you have an episode of diarrhea. If drinking this amount makes your diarrhea worse, try drinking smaller amounts more often. For example, drink 1-3 tsp (5-15 mL) every 5-10 minutes.  A general rule for staying hydrated is to drink 1-2 L of fluid per day. Talk to your health care provider about the specific amount you should be drinking each day. Drink enough fluids to keep your urine clear or pale yellow. Document Released: 03/26/2003 Document Revised: 01/08/2013 Document Reviewed: 11/26/2012 [facility] Patient Information 2015 [place], [place]. This information is not intended to replace advice given to you by your health care provider. Make sure you discuss any questions you have with your health care provider.

## 2013-12-06 LAB — CLOSTRIDIUM DIFFICILE BY PCR: Toxigenic C. Difficile by PCR: NEGATIVE

## 2013-12-07 NOTE — ED Notes (Signed)
"   I feel better" States she is does not feel a return visit is needed

## 2013-12-09 LAB — STOOL CULTURE

## 2013-12-13 NOTE — ED Notes (Signed)
Call for lab reports .After confirming ID,discussedl ab reports

## 2014-03-17 ENCOUNTER — Ambulatory Visit: Payer: Self-pay | Admitting: Family Medicine

## 2014-03-24 ENCOUNTER — Ambulatory Visit: Payer: Self-pay | Admitting: Family Medicine

## 2014-06-30 ENCOUNTER — Ambulatory Visit: Payer: Self-pay | Admitting: Medical

## 2014-07-02 ENCOUNTER — Ambulatory Visit (INDEPENDENT_AMBULATORY_CARE_PROVIDER_SITE_OTHER): Payer: BLUE CROSS/BLUE SHIELD | Admitting: Medical

## 2014-07-02 ENCOUNTER — Encounter: Payer: Self-pay | Admitting: Medical

## 2014-07-02 VITALS — BP 140/80 | HR 78 | Temp 97.9°F | Resp 16 | Wt 144.0 lb

## 2014-07-02 DIAGNOSIS — F39 Unspecified mood [affective] disorder: Secondary | ICD-10-CM

## 2014-07-02 DIAGNOSIS — R358 Other polyuria: Secondary | ICD-10-CM | POA: Diagnosis not present

## 2014-07-02 DIAGNOSIS — M2560 Stiffness of unspecified joint, not elsewhere classified: Secondary | ICD-10-CM | POA: Diagnosis not present

## 2014-07-02 DIAGNOSIS — Z720 Tobacco use: Secondary | ICD-10-CM | POA: Diagnosis not present

## 2014-07-02 DIAGNOSIS — R5383 Other fatigue: Secondary | ICD-10-CM | POA: Insufficient documentation

## 2014-07-02 DIAGNOSIS — M2550 Pain in unspecified joint: Secondary | ICD-10-CM | POA: Insufficient documentation

## 2014-07-02 DIAGNOSIS — Z8269 Family history of other diseases of the musculoskeletal system and connective tissue: Secondary | ICD-10-CM

## 2014-07-02 DIAGNOSIS — R4586 Emotional lability: Secondary | ICD-10-CM

## 2014-07-02 DIAGNOSIS — F17200 Nicotine dependence, unspecified, uncomplicated: Secondary | ICD-10-CM | POA: Insufficient documentation

## 2014-07-02 DIAGNOSIS — R7989 Other specified abnormal findings of blood chemistry: Secondary | ICD-10-CM

## 2014-07-02 DIAGNOSIS — R946 Abnormal results of thyroid function studies: Secondary | ICD-10-CM

## 2014-07-02 DIAGNOSIS — R3589 Other polyuria: Secondary | ICD-10-CM

## 2014-07-02 LAB — POCT URINALYSIS DIPSTICK
Bilirubin, UA: NEGATIVE
GLUCOSE UA: NEGATIVE
Ketones, UA: NEGATIVE
Leukocytes, UA: NEGATIVE
Nitrite, UA: NEGATIVE
Protein, UA: NEGATIVE
RBC UA: NEGATIVE
Spec Grav, UA: 1.015
UROBILINOGEN UA: NEGATIVE
pH, UA: 6

## 2014-07-02 LAB — COMPREHENSIVE METABOLIC PANEL
ALT: 14 U/L (ref 0–35)
AST: 15 U/L (ref 0–37)
Albumin: 4.4 g/dL (ref 3.5–5.2)
Alkaline Phosphatase: 47 U/L (ref 39–117)
BUN: 11 mg/dL (ref 6–23)
CO2: 25 mEq/L (ref 19–32)
Calcium: 9.6 mg/dL (ref 8.4–10.5)
Chloride: 104 mEq/L (ref 96–112)
Creat: 0.73 mg/dL (ref 0.50–1.10)
Glucose, Bld: 83 mg/dL (ref 70–99)
Potassium: 4.4 mEq/L (ref 3.5–5.3)
SODIUM: 137 meq/L (ref 135–145)
TOTAL PROTEIN: 6.9 g/dL (ref 6.0–8.3)
Total Bilirubin: 0.5 mg/dL (ref 0.2–1.2)

## 2014-07-02 LAB — CBC
HCT: 42.9 % (ref 36.0–46.0)
Hemoglobin: 14.3 g/dL (ref 12.0–15.0)
MCH: 29.6 pg (ref 26.0–34.0)
MCHC: 33.3 g/dL (ref 30.0–36.0)
MCV: 88.8 fL (ref 78.0–100.0)
MPV: 9.9 fL (ref 8.6–12.4)
Platelets: 369 10*3/uL (ref 150–400)
RBC: 4.83 MIL/uL (ref 3.87–5.11)
RDW: 14.4 % (ref 11.5–15.5)
WBC: 9.8 10*3/uL (ref 4.0–10.5)

## 2014-07-02 LAB — T4, FREE: Free T4: 1 ng/dL (ref 0.80–1.80)

## 2014-07-02 LAB — URIC ACID: URIC ACID, SERUM: 4.5 mg/dL (ref 2.4–7.0)

## 2014-07-02 LAB — TSH: TSH: 0.691 u[IU]/mL (ref 0.350–4.500)

## 2014-07-02 LAB — RHEUMATOID FACTOR

## 2014-07-02 NOTE — Progress Notes (Signed)
Subjective: Here as a new patient today.  Has seen urgent care and others for acute issues in the past, but no prior primary care consistently.  Here for numerous concerns.  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  4 years ago started feeling bad in general, mornings would feel sweaty, shaky, would eat something and feel better.  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   Went to internist at that time, was put on b12, and later on thyroid labs normalized   Repeat testing over time was just barely within normal range.   Lately still having the same symptoms, not feeling well in general, fatigued all the time, sweating a lot, sweaty palms, soles, armpits, night sweats.   At times heart rate accelerates.   Taking naps at times, just feels crappy.  Left leg constantly numb and tingling for over a year.  Does drink and smoke.    Aware that she should do better with health, smoking and alcohol.   BPs are borderline at times, have been running higher than usual.  Has continued to feel crappy for the last few years.   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, smokes 1ppd, drinks 4 beers daily.  had recent break up with boyfriend of 4 years.  Divorced 15 years ago.  Has had a few bad relationships in her life.  Lives in the past, dwells on things.  Not attending church.   No family in the area.  Family mostly in [place], [place], [place], [place].  Saw a doctor in the past was on Lithium for about 4-6 months, seemed to do ok on this until she realized lithium was for bipolar and stopped the medication, didn't want to be called bipolar.   No other aggravating or relieving factors. No other complaint.  Review of Systems Constitutional: -fever, -chills, +sweats, -unexpected weight change, +fatigue ENT: -runny nose, -ear pain, -sore throat Cardiology:  -chest pain, +palpitations, -edema Respiratory: -cough, -shortness of breath,  -wheezing Gastroenterology: -abdominal pain, -nausea, -vomiting, -diarrhea, -constipation  Hematology: -bleeding or bruising problems Musculoskeletal: morning stiffness for 15 min, +arthralgias bilat knees, pain in left foot, feet and hands at times may feel somewhat swollen in hot weather, -myalgias, -joint swelling, -back pain Ophthalmology: -vision changes Urology: -dysuria, -difficulty urinating, -hematuria, -urinary frequency, -urgency Neurology: +somewhat often headaches, -weakness, + left left tingling ongoing for years now, numbness in left leg.  Gyn: regular, extremely heavy, LMP 06/23/14 Psych:  +hyperactive, depressed mood at times, no crying spells, irritable, family hx/o depression, does have some mood swings    Objective: BP 140/80 mmHg  Pulse 78  Temp(Src) 97.9 F (36.6 C) (Oral)  Resp 16  Wt 144 lb (65.318 kg)  General appearance: alert, no distress, WD/WN, white female HEENT: normocephalic, sclerae anicteric, PERRLA, EOMi, nares patent, no discharge or erythema, pharynx normal Oral cavity: MMM, no lesions Neck: supple, no lymphadenopathy, no thyromegaly, no masses Heart: RRR, normal S1, S2, no murmurs Lungs: CTA bilaterally, no wheezes, rhonchi, or rales Abdomen: +bs, soft, non tender, non distended, no masses, no hepatomegaly, no splenomegaly Back: non tender Musculoskeletal: nontender, no swelling, no obvious deformity Extremities: no edema, no cyanosis, no clubbing Pulses: 2+ symmetric, upper and lower extremities, normal cap refill Neurological: left lower leg seems to be slightly decreased sensation compared to right, otherwise alert, oriented x 3, CN2-12 intact, strength normal upper extremities and lower extremities, sensation normal throughout, DTRs 2+ throughout, no cerebellar signs,  gait normal   Assessment: Encounter Diagnoses  Name Primary?  . Polyarthralgia Yes  . Morning stiffness of joints   . Family history of rheumatic joint disease   . Polyuria    . Abnormal thyroid blood test   . Other fatigue   . Smoker   . Mood swings     Plan: Discussed her numerous concerns.   I recommended she seek counseling to deal with coping and stress and healing past wrongs.   I invited her to church, discussed small groups and having support to deal with "her past" she is still living in.  General panel of labs today to eval her concerns.   She will return soon for a physical and spirometry.  discussed possible multiple causes of her symptoms .  Advised she significantly cut back on alcohol and tobacco, working on quitting tobacco completley.    Discussed her stress, mood, ways to cope.  F/u pending labs.   Spent > 45 minutes face to face in discussion and evaluation.

## 2014-07-02 NOTE — Patient Instructions (Signed)
[facility] [phone] office [email or web address] [address], [place], [place], [place] [address]  Rema Fendt, therapist  Toy Cookey, MA, clinical psychologist  Cognitive-Behavior Therapy; Mood Disorders; Anxiety Disorders; adult and child ADHD; Family Therapy; Stress Management; personal growth, and Marital Therapy.    Terrance Mass Ph.D., clinical psychologist Cognitive-Behavior Therapy; Mood Disorders; Anxiety Disorders; Stress     Management   [facility] [address] [place], [place] [address] [phone]   [facility], psychotherapist [address] [place], [place] [address] [phone]   I invite you to [facility] in [place], as I think this could be helpful.

## 2014-07-02 NOTE — Addendum Note (Signed)
Addended by: Armanda Magic on: 07/02/2014 03:29 PM   Modules accepted: Orders

## 2014-07-03 LAB — SEDIMENTATION RATE: Sed Rate: 1 mm/hr (ref 0–20)

## 2014-07-03 LAB — CYCLIC CITRUL PEPTIDE ANTIBODY, IGG: CYCLIC CITRULLIN PEPTIDE AB: 2.7 U/mL (ref 0.0–5.0)

## 2014-07-03 LAB — ANA: ANA: NEGATIVE

## 2016-02-17 ENCOUNTER — Other Ambulatory Visit: Payer: Self-pay | Admitting: Obstetrics & Gynecology

## 2016-02-17 DIAGNOSIS — R928 Other abnormal and inconclusive findings on diagnostic imaging of breast: Secondary | ICD-10-CM

## 2016-02-22 ENCOUNTER — Ambulatory Visit
Admission: RE | Admit: 2016-02-22 | Discharge: 2016-02-22 | Disposition: A | Discharge status: Home / Self Care | Payer: 59 | Source: Ambulatory Visit | Attending: Obstetrics & Gynecology | Admitting: Obstetrics & Gynecology

## 2016-02-22 DIAGNOSIS — R928 Other abnormal and inconclusive findings on diagnostic imaging of breast: Secondary | ICD-10-CM

## 2017-10-04 DIAGNOSIS — Z23 Encounter for immunization: Secondary | ICD-10-CM | POA: Diagnosis not present

## 2017-12-21 ENCOUNTER — Encounter: Payer: Self-pay | Admitting: Family Medicine

## 2017-12-21 ENCOUNTER — Ambulatory Visit (INDEPENDENT_AMBULATORY_CARE_PROVIDER_SITE_OTHER): Payer: 59 | Admitting: Family Medicine

## 2017-12-21 VITALS — BP 130/82 | HR 64 | Temp 97.9°F | Ht 67.5 in | Wt 142.8 lb

## 2017-12-21 DIAGNOSIS — F17200 Nicotine dependence, unspecified, uncomplicated: Secondary | ICD-10-CM | POA: Diagnosis not present

## 2017-12-21 DIAGNOSIS — I499 Cardiac arrhythmia, unspecified: Secondary | ICD-10-CM | POA: Insufficient documentation

## 2017-12-21 DIAGNOSIS — R5383 Other fatigue: Secondary | ICD-10-CM | POA: Diagnosis not present

## 2017-12-21 DIAGNOSIS — R0683 Snoring: Secondary | ICD-10-CM | POA: Insufficient documentation

## 2017-12-21 DIAGNOSIS — K219 Gastro-esophageal reflux disease without esophagitis: Secondary | ICD-10-CM | POA: Diagnosis not present

## 2017-12-21 DIAGNOSIS — Z1389 Encounter for screening for other disorder: Secondary | ICD-10-CM

## 2017-12-21 DIAGNOSIS — N959 Unspecified menopausal and perimenopausal disorder: Secondary | ICD-10-CM

## 2017-12-21 DIAGNOSIS — L749 Eccrine sweat disorder, unspecified: Secondary | ICD-10-CM | POA: Insufficient documentation

## 2017-12-21 DIAGNOSIS — R7989 Other specified abnormal findings of blood chemistry: Secondary | ICD-10-CM

## 2017-12-21 DIAGNOSIS — Z8249 Family history of ischemic heart disease and other diseases of the circulatory system: Secondary | ICD-10-CM | POA: Insufficient documentation

## 2017-12-21 DIAGNOSIS — R000 Tachycardia, unspecified: Secondary | ICD-10-CM | POA: Insufficient documentation

## 2017-12-21 DIAGNOSIS — Z7689 Persons encountering health services in other specified circumstances: Secondary | ICD-10-CM

## 2017-12-21 DIAGNOSIS — Z803 Family history of malignant neoplasm of breast: Secondary | ICD-10-CM | POA: Diagnosis not present

## 2017-12-21 NOTE — Patient Instructions (Signed)
Your goal blood pressure should be 130/80 or less on a regular basis, or medications should be started/ modified.    Normal blood pressure is less than 120/80.  --Thus Katelyn Hodges, if your blood pressures running high at home, I want you to follow-up sooner than planned so we can discuss treatment options.  -Also remember if you develop any of those red flag symptoms we discussed, I do not want you to wait until next Friday to see cardiology, if that were the case and you developed any of those SX, you would have to go to the emergency room for urgent evaluation     Please think seriously about quitting smoking!  This is very important for your health and well being.   Please let us know in the future if you are interested and ready to quit.  You can also call 1-800-QUIT-NOW 4695071158) for free smoking cessation counseling and support.     Also, please go online to www.heart.org (the American Heart Association website) and search "quit smoking ".     Or try the centers for disease control website at: https://www.schmidt.com/  Or, go to the "national cancer institute" web site of [employer]:  http://benson.com/  There is a lot of great information on these websites for you to look over.      Want to Quit Smoking? FDA-Approved Products Can Help  Quitting smoking can be hard, but it is possible. In fact, every time you put out a cigarette is a new chance to try quitting again, according to the [place]. Food and Drug Administration's newest tobacco education campaign, "Every Try Counts."   If you want to quit-almost 70 percent of adult smokers say they do-you may want to use a "smoking cessation" product proven to help. Data has shown that using FDA-approved cessation medicine can double your chance of quitting successfully.  Some products contain nicotine as an active ingredient and others do not. These products  include over-the-counter (OTC) options like skin patches, lozenges, and gum, as well as prescription medicines.  Smoking cessation products are intended to help you quit smoking. They are regulated through the [facility], which ensures that the products are safe and effective and that their benefits outweigh any known associated risks.  The Benefits of Quitting Smoking No matter how much you smoke-or for how long-quitting will benefit you.  Not only will you lower your risk of getting various cancers, including lung cancer, you'll also reduce your chances of having heart disease, a stroke, emphysema, and other serious diseases. Quitting also will lower the risk of heart disease and lung cancer in nonsmokers who otherwise would be exposed to your secondhand smoke.  Although there are benefits to quitting at any age, it is important to quit as soon as possible so your body can begin to heal from the damage caused by smoking. For instance, 12 hours after you quit smoking the carbon monoxide level in your blood drops to normal. Carbon monoxide is harmful because it displaces oxygen in the blood and deprives your heart, brain, and other vital organs of oxygen.  What To Know About Smoking Cessation Products Understanding how smoking cessation products work-and what side effects they may cause-can help you determine which product may be best for you.  If you're considering one of these products, reading labels and talking to your pharmacist and other health care providers are good first steps to take.  You also can check the FDA's website for more information  on each product at Drugs@FDA , where you can search for each product by name.  And remember to weigh each product's benefits and risks, among other considerations.  About Nicotine Replacement Therapy (NRT) Nicotine is the substance primarily responsible for causing addiction to tobacco products. Tobacco users who  are addicted to nicotine are used to having nicotine in their bodies.  As you try to quit smoking, you may have symptoms of nicotine withdrawal. When you quit, this withdrawal may cause symptoms like cravings, or urges, to smoke; depression; trouble sleeping; irritability; anxiety; and increased appetite.  Nicotine withdrawal can discourage some smokers from continuing with a quit attempt. But the FDA has approved several smoking cessation products designed to help users gradually withdraw from smoking (that is, "wean" themselves from smoking) by using specific amounts of nicotine that decrease over time. This type of product is called a "nicotine replacement therapy" or NRT. It supplies nicotine in controlled amounts while sparing you from other chemicals found in tobacco products.  NRTs are available over the counter and by prescription. You should generally use them only for a short time to help you manage nicotine cravings and withdrawal. However, the FDA recognizes that some people may need to use these products longer to stay smoke-free. Talk to your health care provider to determine the best course of treatment for you.  Over-the-counter NRTs are approved for sale to people age 4 and older. They are available under various brand names and sometimes as generic products. They include:  - Skin patches (also called "transdermal nicotine patches"). These patches are placed on the skin, similar to how you would apply an adhesive bandage. - Chewing gum (also called "nicotine gum"). This gum must be chewed according to the labeled instructions to be effective. - Lozenges (also called "nicotine lozenges"). You use these products by dissolving them in your mouth. For over-the-counter products, it's important to follow the instructions on the Drug Facts Label (DFL) and to read the enclosed User's Guide for complete directions and other important information. Ask your health care provider if you have  questions.  Currently, prescription nicotine replacement therapy is available only under the brand name Nicotrol, and is available both as a nasal spray and an oral inhaler. The products are FDA-approved only for use by adults.  If you are under age 86 and want to quit smoking, talk to a health care professional about whether you should use nicotine replacement therapies.  Important Advice for People Considering Nicotine Replacement Therapy Women who are pregnant or breastfeeding should talk to their health care providers and use nicotine replacement products only if the health care providers approve.  Also talk to your health care provider before using these products if you have:  diabetes, heart disease, asthma, or stomach ulcers; had a recent heart attack; high blood pressure that is not controlled with medicine; a history of irregular heartbeat; or been prescribed medication to help you quit smoking. If you take prescription medication for depression or asthma, tell your health care provider if you are quitting smoking because he or she may need to change your prescription dose.  Stop using a nicotine replacement product and call your health care professional if you have any of the following symptoms: nausea; dizziness; weakness; vomiting; fast or irregular heartbeat; mouth problems with the lozenge or gum; or redness or swelling of the skin around the patch that does not go away.  About Prescription Cessation Medicines Without Nicotine  The FDA has approved two smoking cessation  products that do not contain nicotine. They are Chantix (varenicline tartrate) and Zyban (buproprion hydrochloride). Both are available in tablet form and by prescription only.  Chantix acts at sites in the brain affected by nicotine by reducing the rewarding effects of nicotine. The precise way that Zyban helps with smoking cessation is unknown.  As with other prescription products, the FDA has  evaluated these medicines and found that the benefits outweigh the risks. For users taking these products, risks include changes in behavior, depressed mood, hostility, aggression, and suicidal thoughts or actions.  The most common side effects of Chantix include nausea; constipation; gas; vomiting; and trouble sleeping or vivid, unusual, or strange dreams. Chantix also may change how you react to alcohol, so talk to your health care provider about your drinking habits (if you drink alcohol) and whether these habits need to change. Chantix is not recommended for people under the age of 26.  The most commonly observed side effects consistently associated with the use of Zyban are dry mouth and insomnia.  Because Zyban contains the same active ingredient as the antidepressant Wellbutrin (bupropion), the FDA encourages people who use Zyban-and those who are considering it-to talk to their health care providers about the risks of treatment with antidepressant medicines. Zyban has not been studied in children under the age of 65 and is not approved for use in children and teenagers.  Note: If your health care provider prescribes Chantix or Zyban, please read the product's patient medication guide in its entirety. These guides offer important information on side effects, risks, warnings, product ingredients, and what you should talk about with your health care provider before taking the products.  Finally, if you ever have any side effects related to any smoking cessation products, or have any other problems related to your treatment, the FDA would like to hear from you. Please consider making a voluntary and confidential report to the FDA's MedWatch program.  Updated: December 28, 2015      Hypertension Hypertension, commonly called high blood pressure, is when the force of blood pumping through the arteries is too strong. The arteries are the blood vessels that carry blood from the heart throughout  the body. Hypertension forces the heart to work harder to pump blood and may cause arteries to become narrow or stiff. Having untreated or uncontrolled hypertension can cause heart attacks, strokes, kidney disease, and other problems. A blood pressure reading consists of a higher number over a lower number. Ideally, your blood pressure should be below 120/80. The first ("top") number is called the systolic pressure. It is a measure of the pressure in your arteries as your heart beats. The second ("bottom") number is called the diastolic pressure. It is a measure of the pressure in your arteries as the heart relaxes. What are the causes? The cause of this condition is not known. What increases the risk? Some risk factors for high blood pressure are under your control. Others are not. Factors you can change  Smoking.  Having type 2 diabetes mellitus, high cholesterol, or both.  Not getting enough exercise or physical activity.  Being overweight.  Having too much fat, sugar, calories, or salt (sodium) in your diet.  Drinking too much alcohol. Factors that are difficult or impossible to change  Having chronic kidney disease.  Having a family history of high blood pressure.  Age. Risk increases with age.  Race. You may be at higher risk if you are African-American.  Gender. Men are at higher risk  than women before age 73. After age 72, women are at higher risk than men.  Having obstructive sleep apnea.  Stress. What are the signs or symptoms? Extremely high blood pressure (hypertensive crisis) may cause:  Headache.  Anxiety.  Shortness of breath.  Nosebleed.  Nausea and vomiting.  Severe chest pain.  Jerky movements you cannot control (seizures).  How is this diagnosed? This condition is diagnosed by measuring your blood pressure while you are seated, with your arm resting on a surface. The cuff of the blood pressure monitor will be placed directly against the skin of  your upper arm at the level of your heart. It should be measured at least twice using the same arm. Certain conditions can cause a difference in blood pressure between your right and left arms. Certain factors can cause blood pressure readings to be lower or higher than normal (elevated) for a short period of time:  When your blood pressure is higher when you are in a health care provider's office than when you are at home, this is called white coat hypertension. Most people with this condition do not need medicines.  When your blood pressure is higher at home than when you are in a health care provider's office, this is called masked hypertension. Most people with this condition may need medicines to control blood pressure.  If you have a high blood pressure reading during one visit or you have normal blood pressure with other risk factors:  You may be asked to return on a different day to have your blood pressure checked again.  You may be asked to monitor your blood pressure at home for 1 week or longer.  If you are diagnosed with hypertension, you may have other blood or imaging tests to help your health care provider understand your overall risk for other conditions. How is this treated? This condition is treated by making healthy lifestyle changes, such as eating healthy foods, exercising more, and reducing your alcohol intake. Your health care provider may prescribe medicine if lifestyle changes are not enough to get your blood pressure under control, and if:  Your systolic blood pressure is above 130.  Your diastolic blood pressure is above 80.  Your personal target blood pressure may vary depending on your medical conditions, your age, and other factors. Follow these instructions at home: Eating and drinking  Eat a diet that is high in fiber and potassium, and low in sodium, added sugar, and fat. An example eating plan is called the DASH (Dietary Approaches to Stop Hypertension)  diet. To eat this way: ? Eat plenty of fresh fruits and vegetables. Try to fill half of your plate at each meal with fruits and vegetables. ? Eat whole grains, such as whole wheat pasta, brown rice, or whole grain bread. Fill about one quarter of your plate with whole grains. ? Eat or drink low-fat dairy products, such as skim milk or low-fat yogurt. ? Avoid fatty cuts of meat, processed or cured meats, and poultry with skin. Fill about one quarter of your plate with lean proteins, such as fish, chicken without skin, beans, eggs, and tofu. ? Avoid premade and processed foods. These tend to be higher in sodium, added sugar, and fat.  Reduce your daily sodium intake. Most people with hypertension should eat less than 1,500 mg of sodium a day.  Limit alcohol intake to no more than 1 drink a day for nonpregnant women and 2 drinks a day for men. One drink equals  12 oz of beer, 5 oz of wine, or 1 oz of hard liquor. Lifestyle  Work with your health care provider to maintain a healthy body weight or to lose weight. Ask what an ideal weight is for you.  Get at least 30 minutes of exercise that causes your heart to beat faster (aerobic exercise) most days of the week. Activities may include walking, swimming, or biking.  Include exercise to strengthen your muscles (resistance exercise), such as pilates or lifting weights, as part of your weekly exercise routine. Try to do these types of exercises for 30 minutes at least 3 days a week.  Do not use any products that contain nicotine or tobacco, such as cigarettes and e-cigarettes. If you need help quitting, ask your health care provider.  Monitor your blood pressure at home as told by your health care provider.  Keep all follow-up visits as told by your health care provider. This is important. Medicines  Take over-the-counter and prescription medicines only as told by your health care provider. Follow directions carefully. Blood pressure medicines must  be taken as prescribed.  Do not skip doses of blood pressure medicine. Doing this puts you at risk for problems and can make the medicine less effective.  Ask your health care provider about side effects or reactions to medicines that you should watch for. Contact a health care provider if:  You think you are having a reaction to a medicine you are taking.  You have headaches that keep coming back (recurring).  You feel dizzy.  You have swelling in your ankles.  You have trouble with your vision. Get help right away if:  You develop a severe headache or confusion.  You have unusual weakness or numbness.  You feel faint.  You have severe pain in your chest or abdomen.  You vomit repeatedly.  You have trouble breathing. Summary  Hypertension is when the force of blood pumping through your arteries is too strong. If this condition is not controlled, it may put you at risk for serious complications.  Your personal target blood pressure may vary depending on your medical conditions, your age, and other factors. For most people, a normal blood pressure is less than 120/80.  Hypertension is treated with lifestyle changes, medicines, or a combination of both. Lifestyle changes include weight loss, eating a healthy, low-sodium diet, exercising more, and limiting alcohol. This information is not intended to replace advice given to you by your health care provider. Make sure you discuss any questions you have with your health care provider. Document Released: 01/03/2005 Document Revised: 12/02/2015 Document Reviewed: 12/02/2015 Elsevier Interactive Patient Education  2018 [employer].    How to Take Your Blood Pressure   Blood pressure is a measurement of how strongly your blood is pressing against the walls of your arteries. Arteries are blood vessels that carry blood from your heart throughout your body. Your health care provider takes your blood pressure at each office visit.  You can also take your own blood pressure at home with a blood pressure machine. You may need to take your own blood pressure:  To confirm a diagnosis of high blood pressure (hypertension).  To monitor your blood pressure over time.  To make sure your blood pressure medicine is working.  Supplies needed: To take your blood pressure, you will need a blood pressure machine. You can buy a blood pressure machine, or blood pressure monitor, at most drugstores or online. There are several types of home blood  pressure monitors. When choosing one, consider the following:  Choose a monitor that has an arm cuff.  Choose a monitor that wraps snugly around your upper arm. You should be able to fit only one finger between your arm and the cuff.  Do not choose a monitor that measures your blood pressure from your wrist or finger.  Your health care provider can suggest a reliable monitor that will meet your needs. How to prepare To get the most accurate reading, avoid the following for 30 minutes before you check your blood pressure:  Drinking caffeine.  Drinking alcohol.  Eating.  Smoking.  Exercising.  Five minutes before you check your blood pressure:  Empty your bladder.  Sit quietly without talking in a dining chair, rather than in a soft couch or armchair.  How to take your blood pressure To check your blood pressure, follow the instructions in the manual that came with your blood pressure monitor. If you have a digital blood pressure monitor, the instructions may be as follows: 1. Sit up straight. 2. Place your feet on the floor. Do not cross your ankles or legs. 3. Rest your left arm at the level of your heart on a table or desk or on the arm of a chair. 4. Pull up your shirt sleeve. 5. Wrap the blood pressure cuff around the upper part of your left arm, 1 inch (2.5 cm) above your elbow. It is best to wrap the cuff around bare skin. 6. Fit the cuff snugly around your arm. You  should be able to place only one finger between the cuff and your arm. 7. Position the cord inside the groove of your elbow. 8. Press the power button. 9. Sit quietly while the cuff inflates and deflates. 10. Read the digital reading on the monitor screen and write it down (record it). 11. Wait 2-3 minutes, then repeat the steps, starting at step 1.  What does my blood pressure reading mean? A blood pressure reading consists of a higher number over a lower number. Ideally, your blood pressure should be below 120/80. The first ("top") number is called the systolic pressure. It is a measure of the pressure in your arteries as your heart beats. The second ("bottom") number is called the diastolic pressure. It is a measure of the pressure in your arteries as the heart relaxes. Blood pressure is classified into four stages. The following are the stages for adults who do not have a short-term serious illness or a chronic condition. Systolic pressure and diastolic pressure are measured in a unit called mm Hg. Normal  Systolic pressure: below 283.  Diastolic pressure: below 80. Elevated  Systolic pressure: 151-761.  Diastolic pressure: below 80. Hypertension stage 1  Systolic pressure: 607-371.  Diastolic pressure: 06-26. Hypertension stage 2  Systolic pressure: 948 or above.  Diastolic pressure: 90 or above. You can have prehypertension or hypertension even if only the systolic or only the diastolic number in your reading is higher than normal. Follow these instructions at home:  Check your blood pressure as often as recommended by your health care provider.  Take your monitor to the next appointment with your health care provider to make sure: ? That you are using it correctly. ? That it provides accurate readings.  Be sure you understand what your goal blood pressure numbers are.  Tell your health care provider if you are having any side effects from blood pressure  medicine. Contact a health care provider if:  Your blood  pressure is consistently high. Get help right away if:  Your systolic blood pressure is higher than 180.  Your diastolic blood pressure is higher than 110. This information is not intended to replace advice given to you by your health care provider. Make sure you discuss any questions you have with your health care provider. Document Released: 06/12/2015 Document Revised: 08/25/2015 Document Reviewed: 06/12/2015 Elsevier Interactive Patient Education  [employer].

## 2017-12-21 NOTE — Progress Notes (Signed)
New patient office visit note:  Impression and Recommendations:    1. Encounter to establish care with new doctor   2. Irregular heart rate   3. Tachycardia   4. Sweating abnormality   5. Family history of early CAD-  maternal uncles in 41s, paternal uncles in 27s   6. Smoker   7. Family history of breast cancer in mother- age 52   8. Screening for multiple conditions   9. Gastroesophageal reflux disease, esophagitis presence not specified   10. h/o Abnormal thyroid stimulating hormone (TSH) level   11. Perimenopausal disorder   12. chronic fatigue   13. Loud snoring      Encounter to establish care with new doctor - Plan: EKG 12-Lead, Magnesium, Phosphorus, TSH, T4, free, VITAMIN D 25 Hydroxy (Vit-D Deficiency, Fractures), Vitamin B12, Lipid panel, Hemoglobin A1c, Comprehensive metabolic panel, CBC with Differential/Platelet, Ambulatory referral to Cardiology  Irregular heart rate - Plan: EKG 12-Lead, Magnesium, Phosphorus, TSH, T4, free, VITAMIN D 25 Hydroxy (Vit-D Deficiency, Fractures), Vitamin B12, Lipid panel, Hemoglobin A1c, Comprehensive metabolic panel, CBC with Differential/Platelet, Ambulatory referral to Cardiology  Tachycardia - Plan: EKG 12-Lead, Magnesium, Phosphorus, TSH, T4, free, VITAMIN D 25 Hydroxy (Vit-D Deficiency, Fractures), Vitamin B12, Lipid panel, Hemoglobin A1c, Comprehensive metabolic panel, CBC with Differential/Platelet, Ambulatory referral to Cardiology  Sweating abnormality - Plan: EKG 12-Lead, Magnesium, Phosphorus, TSH, T4, free, VITAMIN D 25 Hydroxy (Vit-D Deficiency, Fractures), Vitamin B12, Lipid panel, Hemoglobin A1c, Comprehensive metabolic panel, CBC with Differential/Platelet, Ambulatory referral to Cardiology  Family history of early CAD-  maternal uncles in 71s, paternal uncles in 25s - Plan: EKG 12-Lead, Magnesium, Phosphorus, TSH, T4, free, VITAMIN D 25 Hydroxy (Vit-D Deficiency, Fractures), Vitamin B12, Lipid panel, Hemoglobin  A1c, Comprehensive metabolic panel, CBC with Differential/Platelet, Ambulatory referral to Cardiology  Smoker - Plan: EKG 12-Lead  Family history of breast cancer in mother- age 15  Screening for multiple conditions - Plan: EKG 12-Lead, Magnesium, Phosphorus, TSH, T4, free, VITAMIN D 25 Hydroxy (Vit-D Deficiency, Fractures), Vitamin B12, Lipid panel, Hemoglobin A1c, Comprehensive metabolic panel, CBC with Differential/Platelet, Ambulatory referral to Cardiology  Gastroesophageal reflux disease, esophagitis presence not specified  h/o Abnormal thyroid stimulating hormone (TSH) level - Plan: TSH, T4, free  Perimenopausal disorder  chronic fatigue - Plan: Vitamin B12  Loud snoring   1. Concerning cardiac symptoms, family hx of early CAD -Due to the patients' ongoing symptoms, I will have an EKG completed today in the office to rule out an old MI.  -EKG completed in the office today showing NSR. No abnormalities noted today.  -Discussed that if the patient has exertional CP, that she will need to proceed to the nearest Emergency Department.  -I will still, however, refer the patient to a cardiologist due to her significant family hx of CAD.  -Appointment made for 12/29/2017 at 3 PM with Dr. Debara Pickett.   2. Chronic fatigue -Epworth Sleepiness Scale completed in the office today to see if she is a high risk for obstructive sleep apnea.  -Epworth Sleepiness Scale with a total score of 6 which is in the normal range.   3. Perimenopausal Changes. -Follow up with Dr. Milta Deiters for hormonal changes due to patient being in menopause.    Education and routine counseling performed. Handouts provided.    Orders Placed This Encounter  Procedures  . Magnesium  . Phosphorus  . TSH  . T4, free  . VITAMIN D 25 Hydroxy (Vit-D Deficiency, Fractures)  . Vitamin B12  .  Lipid panel  . Hemoglobin A1c  . Comprehensive metabolic panel  . CBC with Differential/Platelet  . Ambulatory referral to Cardiology   . EKG 12-Lead     Medications Discontinued During This Encounter  Medication Reason  . ibuprofen (ADVIL,MOTRIN) 600 MG tablet Patient Preference  . Multiple Vitamin (MULTIVITAMIN WITH MINERALS) TABS tablet Patient Preference  . vitamin B-12 (CYANOCOBALAMIN) 1000 MCG tablet Patient Preference      Gross side effects, risk and benefits, and alternatives of medications discussed with patient.  Patient is aware that all medications have potential side effects and we are unable to predict every side effect or drug-drug interaction that may occur.  Expresses verbal understanding and consents to current therapy plan and treatment regimen.  Return for Follow-up 4-6 months for BP monitoring and as needed.  Please see AVS handed out to patient at the end of our visit for further patient instructions/ counseling done pertaining to today's office visit.    Note:  This document was prepared using Dragon voice recognition software and may include unintentional dictation errors.   This document serves as a record of services personally performed by Mellody Dance, DO. It was created on her behalf by Steva Colder, a trained medical scribe. The creation of this record is based on the scribe's personal observations and the provider's statements to them.   I have reviewed the above medical documentation for accuracy and completeness and I concur.  Mellody Dance, DO 12/25/2017 2:22 PM      ---------------------------------------------------------------------------------------------------------------------------------------------------------------------------------------------    Subjective:    Chief complaint:   Chief Complaint  Patient presents with  . Establish Care     HPI: Katelyn Hodges is a pleasant 52 y.o. female who presents to [place] [facility] ([place] today to review their medical history with me and establish care.   I asked the patient to review their chronic  problem list with me to ensure everything was updated and accurate.    All recent office visits with other providers, any medical records that patient brought in etc  - I reviewed today.     We asked pt to get [place] their medical records from [facility] providers/ specialists that they had seen within the past 3-5 years- if they are in private practice and/or do not work for [employer], [facility], [place], [place] or [employer] owned practice.  Told them to call their specialists to clarify this if they are not sure.    PMHx:  She has noted elevated blood pressure, however, she hasn't been diagnosed with this. Her normal blood pressure is 90/60, however, for the past two days, she has noted that she has had increased palpitations and her blood pressure has been elevated yesterday. On yesterday, she had a hot flash with a heart rate of 126 and her blood pressure was at 139/70. Her heart rate has been elevated for the past two weeks. Then 158/77 after her trying to cool down after the initial episode.   She reports nausea, increased heaviness, left arm pain (to mid upper arm), chronic jaw pain with fullness in her jaw, chest tightness (alleviated with burping and chronic), and increased diaphoresis. She notes decreased energy levels after taking a shower. These symptoms have increased since onset. She denies having a MI or stroke. Her last set of labs was 3 years ago.   She has a hx of hyperthyroidism with hair thinning.     She also snores loudly which has been going on for  a long time.   She notes hot flashes primarily at night, however, occurring during the day as well. Her GYN is Dr. Milta Deiters.    FHx: Her maternal grandfather had lung and mouth cancer. Her mother had breast cancer at age 18. On her maternal side there is heart disease diagnosed in 85s' and two uncles with a heart attack in the early 6's. Her maternal grandmother had DM. Her paternal uncles with MI in their 59's.   PSHx She had a breast  lumpectomy and laparoscopy. She goes yearly for mammograms.   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 She used to live in [place] and moved to [place] 13 years ago. She has a boyfriend named Will and she is divorced. She doesn't have any children. She is a current smoker x 30 years and 1 PPD. She doesn't go to the gym. She consumes beer or wine occasionally throughout the week. She is sexually active. She consumes 3-4 cups of coffee. She consumes 64 ounces of water daily.   Wt Readings from Last 3 Encounters:  12/21/17 142 lb 12.8 oz (64.8 kg)  07/02/14 144 lb (65.3 kg)  01/02/13 145 lb 9.6 oz (66 kg)   BP Readings from Last 3 Encounters:  12/21/17 130/82  07/02/14 140/80  12/05/13 136/100   Pulse Readings from Last 3 Encounters:  12/21/17 64  07/02/14 78  01/02/13 71   BMI Readings from Last 3 Encounters:  12/21/17 22.04 kg/m  07/02/14 22.55 kg/m  01/02/13 22.80 kg/m    Patient Care Team    Relationship Specialty Notifications Start End  Mellody Dance, DO PCP - General Family Medicine  12/21/17   Maisie Fus, MD Consulting Physician Obstetrics and Gynecology  12/21/17     Patient Active Problem List   Diagnosis Date Noted  . Smoker 07/02/2014    Priority: High  . Tobacco use disorder 06/20/2010    Priority: High  . GERD (gastroesophageal reflux disease) 06/20/2010    Priority: Medium  . 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 Abnormal thyroid stimulating hormone (TSH) level 06/20/2010    Priority: Medium  . chronic fatigue 07/02/2014    Priority: Low  . Family history of breast cancer in mother- age 20 12/21/2017  . Family history of early CAD-  maternal uncles in 63s, paternal uncles in 58s 12/21/2017  . Tachycardia 12/21/2017  . Sweating abnormality 12/21/2017  . Irregular heart rate 12/21/2017  . Perimenopausal disorder 12/21/2017  . Loud snoring 12/21/2017  .  Polyarthralgia 07/02/2014  . Morning stiffness of joints 07/02/2014  . Family history of rheumatic joint disease 07/02/2014  . Polyuria 07/02/2014  . Mood swings 07/02/2014  . Paresthesias 06/20/2010       As reported by pt:  Past Medical History:  Diagnosis Date  . Genital warts   . GERD (gastroesophageal reflux disease)   . Glaucoma   . Headache(784.0)   . Ulcer      Past Surgical History:  Procedure Laterality Date  . BREAST LUMPECTOMY  1998  . TONSILLECTOMY       Family History  Problem Relation Age of Onset  . Cancer Mother        breast  . Heart disease Father   . Hyperlipidemia Father   . Nephritis Sister   . Heart disease Maternal Uncle   . Cancer Maternal Uncle   .  Hyperlipidemia Maternal Uncle   . Hypertension Maternal Uncle   . Heart disease Paternal Uncle   . Diabetes Maternal Grandmother   . Heart disease Maternal Grandfather   . Cancer Maternal Grandfather   . Hyperlipidemia Maternal Grandfather   . Hypertension Maternal Grandfather   . Heart disease Paternal Grandfather   . Cancer Paternal Grandfather      Social History   Substance and Sexual Activity  Drug Use No     Social History   Substance and Sexual Activity  Alcohol Use Yes  . Alcohol/week: 6.0 standard drinks  . Types: 6 Standard drinks or equivalent per week     Social History   Tobacco Use  Smoking Status Current Every Day Smoker  . Packs/day: 1.00  . Years: 30.00  . Pack years: 30.00  . Types: Cigarettes  Smokeless Tobacco Never Used     Current Meds  Medication Sig  . escitalopram (LEXAPRO) 20 MG tablet Take 20 mg by mouth daily.    Allergies: Patient has no known allergies.   Review of Systems  Constitutional: Positive for diaphoresis. Negative for chills, fever, malaise/fatigue and weight loss.  HENT: Negative for congestion, sore throat and tinnitus.   Eyes: Negative for blurred vision, double vision and photophobia.  Respiratory: Negative for  cough and wheezing.   Cardiovascular: Positive for palpitations. Negative for chest pain.  Gastrointestinal: Positive for nausea. Negative for blood in stool, diarrhea and vomiting.  Genitourinary: Negative for dysuria, frequency and urgency.  Musculoskeletal: Negative for joint pain and myalgias.  Skin: Negative for itching and rash.  Neurological: Negative for dizziness, focal weakness, weakness and headaches.  Endo/Heme/Allergies: Negative for environmental allergies and polydipsia. Does not bruise/bleed easily.  Psychiatric/Behavioral: Negative for depression and memory loss. The patient is not nervous/anxious and does not have insomnia.         Objective:   Blood pressure 130/82, pulse 64, temperature 97.9 F (36.6 C), height 5' 7.5" (1.715 m), weight 142 lb 12.8 oz (64.8 kg), SpO2 100 %. Body mass index is 22.04 kg/m. General: Well Developed, well nourished, and in no acute distress.  Neuro: Alert and oriented x3, extra-ocular muscles intact, sensation grossly intact.  HEENT:[place]/AT, PERRLA, neck supple, No carotid bruits. No thyromegaly noted.  Skin: no gross rashes  Cardiac: Regular rate and rhythm Respiratory: Essentially clear to auscultation bilaterally. Not using accessory muscles, speaking in full sentences.  Abdominal: not grossly distended Musculoskeletal: Ambulates w/o diff, FROM * 4 ext.  Vasc: less 2 sec cap RF, warm and pink  Psych:  No HI/SI, judgement and insight good, Euthymic mood. Full Affect.    Recent Results (from the past 2160 hour(s))  Magnesium     Status: None   Collection Time: 12/21/17  3:12 PM  Result Value Ref Range   Magnesium 2.1 1.6 - 2.3 mg/dL  Phosphorus     Status: None   Collection Time: 12/21/17  3:12 PM  Result Value Ref Range   Phosphorus 3.8 2.5 - 4.5 mg/dL  TSH     Status: None   Collection Time: 12/21/17  3:12 PM  Result Value Ref Range   TSH 0.969 0.450 - 4.500 uIU/mL  T4, free     Status: None   Collection Time: 12/21/17   3:12 PM  Result Value Ref Range   Free T4 1.14 0.82 - 1.77 ng/dL  VITAMIN D 25 Hydroxy (Vit-D Deficiency, Fractures)     Status: Abnormal   Collection Time: 12/21/17  3:12 PM  Result  Value Ref Range   Vit D, 25-Hydroxy 14.7 (L) 30.0 - 100.0 ng/mL    Comment: Vitamin D deficiency has been defined by the [place] practice guideline as a level of serum 25-OH vitamin D less than 20 ng/mL (1,2). The Endocrine Society went on to further define vitamin D insufficiency as a level between 21 and 29 ng/mL (2). 1. IOM (Institute of Medicine). 2010. Dietary reference    intakes for calcium and D. [place]: The    [employer]. 2. Holick MF, Binkley [place], Bischoff-Ferrari HA, et al.    Evaluation, treatment, and prevention of vitamin D    deficiency: an Endocrine Society clinical practice    guideline. JCEM. 2011 Jul; 96(7):1911-30.   Vitamin B12     Status: None   Collection Time: 12/21/17  3:12 PM  Result Value Ref Range   Vitamin B-12 542 232 - 1,245 pg/mL  Lipid panel     Status: Abnormal   Collection Time: 12/21/17  3:12 PM  Result Value Ref Range   Cholesterol, Total 227 (H) 100 - 199 mg/dL   Triglycerides 104 0 - 149 mg/dL   HDL 92 >39 mg/dL   VLDL Cholesterol Cal 21 5 - 40 mg/dL   LDL Calculated 114 (H) 0 - 99 mg/dL   Chol/HDL Ratio 2.5 0.0 - 4.4 ratio    Comment:                                   T. Chol/HDL Ratio                                             Men  Women                               1/2 Avg.Risk  3.4    3.3                                   Avg.Risk  5.0    4.4                                2X Avg.Risk  9.6    7.1                                3X Avg.Risk 23.4   11.0   Hemoglobin A1c     Status: None   Collection Time: 12/21/17  3:12 PM  Result Value Ref Range   Hgb A1c MFr Bld 5.3 4.8 - 5.6 %    Comment:          Prediabetes: 5.7 - 6.4          Diabetes: >6.4          Glycemic control for adults with  diabetes: <7.0    Est. average glucose Bld gHb Est-mCnc 105 mg/dL  Comprehensive metabolic panel     Status: Abnormal   Collection Time: 12/21/17  3:12 PM  Result Value Ref Range   Glucose 66 65 - 99 mg/dL  BUN 11 6 - 24 mg/dL   Creatinine, Ser 0.77 0.57 - 1.00 mg/dL   GFR calc non Af Amer 89 >59 mL/min/1.73   GFR calc Af Amer 103 >59 mL/min/1.73   BUN/Creatinine Ratio 14 9 - 23   Sodium 140 134 - 144 mmol/L   Potassium 4.1 3.5 - 5.2 mmol/L   Chloride 99 96 - 106 mmol/L   CO2 22 20 - 29 mmol/L   Calcium 9.8 8.7 - 10.2 mg/dL   Total Protein 7.2 6.0 - 8.5 g/dL   Albumin 5.0 3.5 - 5.5 g/dL   Globulin, Total 2.2 1.5 - 4.5 g/dL   Albumin/Globulin Ratio 2.3 (H) 1.2 - 2.2   Bilirubin Total 0.4 0.0 - 1.2 mg/dL   Alkaline Phosphatase 71 39 - 117 IU/L   AST 30 0 - 40 IU/L   ALT 23 0 - 32 IU/L  CBC with Differential/Platelet     Status: Abnormal   Collection Time: 12/21/17  3:12 PM  Result Value Ref Range   WBC 8.6 3.4 - 10.8 x10E3/uL   RBC 4.87 3.77 - 5.28 x10E6/uL   Hemoglobin 14.0 11.1 - 15.9 g/dL   Hematocrit 41.7 34.0 - 46.6 %   MCV 86 79 - 97 fL   MCH 28.7 26.6 - 33.0 pg   MCHC 33.6 31.5 - 35.7 g/dL   RDW 15.5 (H) 12.3 - 15.4 %   Platelets 388 150 - 450 x10E3/uL   Neutrophils 59 Not Estab. %   Lymphs 30 Not Estab. %   Monocytes 7 Not Estab. %   Eos 3 Not Estab. %   Basos 1 Not Estab. %   Neutrophils Absolute 5.2 1.4 - 7.0 x10E3/uL   Lymphocytes Absolute 2.6 0.7 - 3.1 x10E3/uL   Monocytes Absolute 0.6 0.1 - 0.9 x10E3/uL   EOS (ABSOLUTE) 0.2 0.0 - 0.4 x10E3/uL   Basophils Absolute 0.1 0.0 - 0.2 x10E3/uL   Immature Granulocytes 0 Not Estab. %   Immature Grans (Abs) 0.0 0.0 - 0.1 x10E3/uL

## 2017-12-22 ENCOUNTER — Telehealth: Payer: Self-pay | Admitting: Family Medicine

## 2017-12-22 LAB — CBC WITH DIFFERENTIAL/PLATELET
BASOS ABS: 0.1 10*3/uL (ref 0.0–0.2)
Basos: 1 %
EOS (ABSOLUTE): 0.2 10*3/uL (ref 0.0–0.4)
Eos: 3 %
Hematocrit: 41.7 % (ref 34.0–46.6)
Hemoglobin: 14 g/dL (ref 11.1–15.9)
IMMATURE GRANS (ABS): 0 10*3/uL (ref 0.0–0.1)
Immature Granulocytes: 0 %
Lymphocytes Absolute: 2.6 10*3/uL (ref 0.7–3.1)
Lymphs: 30 %
MCH: 28.7 pg (ref 26.6–33.0)
MCHC: 33.6 g/dL (ref 31.5–35.7)
MCV: 86 fL (ref 79–97)
Monocytes Absolute: 0.6 10*3/uL (ref 0.1–0.9)
Monocytes: 7 %
Neutrophils Absolute: 5.2 10*3/uL (ref 1.4–7.0)
Neutrophils: 59 %
Platelets: 388 10*3/uL (ref 150–450)
RBC: 4.87 x10E6/uL (ref 3.77–5.28)
RDW: 15.5 % — ABNORMAL HIGH (ref 12.3–15.4)
WBC: 8.6 10*3/uL (ref 3.4–10.8)

## 2017-12-22 LAB — LIPID PANEL
Chol/HDL Ratio: 2.5 ratio (ref 0.0–4.4)
Cholesterol, Total: 227 mg/dL — ABNORMAL HIGH (ref 100–199)
HDL: 92 mg/dL (ref >39)
LDL CALC: 114 mg/dL — AB (ref 0–99)
Triglycerides: 104 mg/dL (ref 0–149)
VLDL Cholesterol Cal: 21 mg/dL (ref 5–40)

## 2017-12-22 LAB — COMPREHENSIVE METABOLIC PANEL
ALT: 23 IU/L (ref 0–32)
AST: 30 IU/L (ref 0–40)
Albumin/Globulin Ratio: 2.3 — ABNORMAL HIGH (ref 1.2–2.2)
Albumin: 5 g/dL (ref 3.5–5.5)
Alkaline Phosphatase: 71 IU/L (ref 39–117)
BUN/Creatinine Ratio: 14 (ref 9–23)
BUN: 11 mg/dL (ref 6–24)
Bilirubin Total: 0.4 mg/dL (ref 0.0–1.2)
CHLORIDE: 99 mmol/L (ref 96–106)
CO2: 22 mmol/L (ref 20–29)
Calcium: 9.8 mg/dL (ref 8.7–10.2)
Creatinine, Ser: 0.77 mg/dL (ref 0.57–1.00)
GFR calc Af Amer: 103 mL/min/{1.73_m2} (ref >59)
GFR calc non Af Amer: 89 mL/min/{1.73_m2} (ref >59)
Globulin, Total: 2.2 g/dL (ref 1.5–4.5)
Glucose: 66 mg/dL (ref 65–99)
Potassium: 4.1 mmol/L (ref 3.5–5.2)
Sodium: 140 mmol/L (ref 134–144)
Total Protein: 7.2 g/dL (ref 6.0–8.5)

## 2017-12-22 LAB — HEMOGLOBIN A1C
ESTIMATED AVERAGE GLUCOSE: 105 mg/dL
Hgb A1c MFr Bld: 5.3 % (ref 4.8–5.6)

## 2017-12-22 LAB — MAGNESIUM: Magnesium: 2.1 mg/dL (ref 1.6–2.3)

## 2017-12-22 LAB — PHOSPHORUS: Phosphorus: 3.8 mg/dL (ref 2.5–4.5)

## 2017-12-22 LAB — T4, FREE: FREE T4: 1.14 ng/dL (ref 0.82–1.77)

## 2017-12-22 LAB — TSH: TSH: 0.969 u[IU]/mL (ref 0.450–4.500)

## 2017-12-22 LAB — VITAMIN D 25 HYDROXY (VIT D DEFICIENCY, FRACTURES): Vit D, 25-Hydroxy: 14.7 ng/mL — ABNORMAL LOW (ref 30.0–100.0)

## 2017-12-22 LAB — VITAMIN B12: Vitamin B-12: 542 pg/mL (ref 232–1245)

## 2017-12-22 NOTE — Telephone Encounter (Signed)
Patient called for lab results- advised provider & staff gone for the day 12/6 & would forward note to medical asst to call patient on next business day.   ---Forwarding message to medical assistant.  --Dion Body

## 2017-12-26 ENCOUNTER — Encounter: Payer: Self-pay | Admitting: Family Medicine

## 2017-12-26 DIAGNOSIS — E559 Vitamin D deficiency, unspecified: Secondary | ICD-10-CM | POA: Insufficient documentation

## 2017-12-27 NOTE — Telephone Encounter (Signed)
My Chart message with results sent to the patient. MPulliam, CMA/RT(R)

## 2017-12-29 ENCOUNTER — Ambulatory Visit (INDEPENDENT_AMBULATORY_CARE_PROVIDER_SITE_OTHER): Payer: 59 | Admitting: Internal Medicine

## 2017-12-29 ENCOUNTER — Encounter: Payer: Self-pay | Admitting: Internal Medicine

## 2017-12-29 VITALS — BP 131/69 | HR 67 | Ht 67.5 in | Wt 144.8 lb

## 2017-12-29 DIAGNOSIS — R000 Tachycardia, unspecified: Secondary | ICD-10-CM

## 2017-12-29 DIAGNOSIS — Z8249 Family history of ischemic heart disease and other diseases of the circulatory system: Secondary | ICD-10-CM

## 2017-12-29 DIAGNOSIS — Z720 Tobacco use: Secondary | ICD-10-CM | POA: Diagnosis not present

## 2017-12-29 NOTE — Patient Instructions (Signed)
Medication Instructions:  Continue current medications If you need a refill on your cardiac medications before your next appointment, please call your pharmacy.   Testing/Procedures: Dr. Debara Pickett has ordered a 3 day monitor (ZIO).   Dr. Debara Pickett has ordered a CT coronary calcium score. This is $150 out of pocket.   ** both appointments are at [address]  Coronary CalciumScan A coronary calcium scan is an imaging test used to look for deposits of calcium and other fatty materials (plaques) in the inner lining of the blood vessels of the heart (coronary arteries). These deposits of calcium and plaques can partly clog and narrow the coronary arteries without producing any symptoms or warning signs. This puts a person at risk for a heart attack. This test can detect these deposits before symptoms develop. Tell a health care provider about:  Any allergies you have.  All medicines you are taking, including vitamins, herbs, eye drops, creams, and over-the-counter medicines.  Any problems you or family members have had with anesthetic medicines.  Any blood disorders you have.  Any surgeries you have had.  Any medical conditions you have.  Whether you are pregnant or may be pregnant. What are the risks? Generally, this is a safe procedure. However, problems may occur, including:  Harm to a pregnant woman and her unborn baby. This test involves the use of radiation. Radiation exposure can be dangerous to a pregnant woman and her unborn baby. If you are pregnant, you generally should not have this procedure done.  Slight increase in the risk of cancer. This is because of the radiation involved in the test. What happens before the procedure? No preparation is needed for this procedure. What happens during the procedure?  You will undress and remove any jewelry around your neck or chest.  You will put on a hospital gown.  Sticky electrodes will be placed on your chest. The  electrodes will be connected to an electrocardiogram (ECG) machine to record a tracing of the electrical activity of your heart.  A CT scanner will take pictures of your heart. During this time, you will be asked to lie still and hold your breath for 2-3 seconds while a picture of your heart is being taken. The procedure may vary among health care providers and hospitals. What happens after the procedure?  You can get dressed.  You can return to your normal activities.  It is up to you to get the results of your test. Ask your health care provider, or the department that is doing the test, when your results will be ready. Summary  A coronary calcium scan is an imaging test used to look for deposits of calcium and other fatty materials (plaques) in the inner lining of the blood vessels of the heart (coronary arteries).  Generally, this is a safe procedure. Tell your health care provider if you are pregnant or may be pregnant.  No preparation is needed for this procedure.  A CT scanner will take pictures of your heart.  You can return to your normal activities after the scan is done. This information is not intended to replace advice given to you by your health care provider. Make sure you discuss any questions you have with your health care provider. Document Released: 07/02/2007 Document Revised: 11/23/2015 Document Reviewed: 11/23/2015 Elsevier Interactive Patient Education  2017 [place]: At [facility], you and your health needs are our priority.  As part of our continuing mission  to provide you with exceptional heart care, we have created designated Provider Care Teams.  These Care Teams include your primary Cardiologist (physician) and Advanced Practice Providers (APPs -  Physician Assistants and Nurse Practitioners) who all work together to provide you with the care you need, when you need it. You will need a follow up appointment in 3-4 weeks after monitor &  calcium score test. You may see Dr. Debara Pickett or one of the following Advanced Practice Providers on your designated Care Team: Almyra Deforest, [place] . Fabian Sharp, PA-C  Any Other Special Instructions Will Be Listed Below (If Applicable).

## 2018-01-01 ENCOUNTER — Encounter: Payer: Self-pay | Admitting: Internal Medicine

## 2018-01-01 NOTE — Progress Notes (Signed)
OFFICE CONSULT NOTE  Chief Complaint:  Irregular heart rate  Primary Care Physician: Katelyn Dance, DO  HPI:  Katelyn Hodges is a 52 y.o. female who is being seen today for the evaluation of irregular heart rate at the request of Katelyn Dance, DO.  This is a pleasant 52 year old female who recently established care with Dr. Raliegh Hodges.  She is referred for irregular heartbeat.  According to her recent office encounter.  She is experiencing some tachycardia.  He has noted an elevated heart rate and at times palpitations and abnormalities in beating.  She is noted to have a family history of early onset heart disease.  An EKG performed in the office showed normal sinus rhythm.  Laboratory work showed normal electrolytes.  She was referred for evaluation of this by cardiology.  She has no other medical problems that are known but is having symptoms of chronic fatigue and possible perimenopausal changes.  She reports symptoms of hot flashes and tachycardia which could be consistent with that.  She is on some Lexapro for mood stabilization but cannot tell that is very helpful.  He describes periods of tachycardia and palpitations which are periodic, typically in the morning and may be associated with flushing.  She notes significant hyperthermia and hyperhidrosis, particularly at night at times causing drenching sweats.  PMHx:  Past Medical History:  Diagnosis Date  . Genital warts   . GERD (gastroesophageal reflux disease)   . Glaucoma   . Headache(784.0)   . Ulcer     Past Surgical History:  Procedure Laterality Date  . BREAST LUMPECTOMY  1998  . TONSILLECTOMY      FAMHx:  Family History  Problem Relation Age of Onset  . Cancer Mother        breast  . Heart disease Father   . Hyperlipidemia Father   . Nephritis Sister   . Heart disease Maternal Uncle   . Cancer Maternal Uncle   . Hyperlipidemia Maternal Uncle   . Hypertension Maternal Uncle   . Heart disease Paternal Uncle    . Diabetes Maternal Grandmother   . Heart disease Maternal Grandfather   . Cancer Maternal Grandfather   . Hyperlipidemia Maternal Grandfather   . Hypertension Maternal Grandfather   . Heart disease Paternal Grandfather   . Cancer Paternal Grandfather     SOCHx:   reports that she has been smoking cigarettes. She has a 30.00 pack-year smoking history. She has never used smokeless tobacco. She reports current alcohol use of about 6.0 standard drinks of alcohol per week. She reports that she does not use drugs.  ALLERGIES:  No Known Allergies  ROS: Pertinent items noted in HPI and remainder of comprehensive ROS otherwise negative.  HOME MEDS: Current Outpatient Medications on File Prior to Visit  Medication Sig Dispense Refill  . escitalopram (LEXAPRO) 20 MG tablet Take 20 mg by mouth daily.     No current facility-administered medications on file prior to visit.     LABS/IMAGING: No results found for this or any previous visit (from the past 48 hour(s)). No results found.  LIPID PANEL:    Component Value Date/Time   CHOL 227 (H) 12/21/2017 1512   TRIG 104 12/21/2017 1512   HDL 92 12/21/2017 1512   CHOLHDL 2.5 12/21/2017 1512   LDLCALC 114 (H) 12/21/2017 1512    WEIGHTS: Wt Readings from Last 3 Encounters:  12/29/17 144 lb 12.8 oz (65.7 kg)  12/21/17 142 lb 12.8 oz (64.8 kg)  07/02/14  144 lb (65.3 kg)    VITALS: BP 131/69   Pulse 67   Ht 5' 7.5" (1.715 m)   Wt 144 lb 12.8 oz (65.7 kg)   BMI 22.34 kg/m   EXAM: General appearance: alert and no distress Neck: no adenopathy, no carotid bruit, no JVD, supple, symmetrical, trachea midline and thyroid not enlarged, symmetric, no tenderness/mass/nodules Lungs: clear to auscultation bilaterally Heart: regular rate and rhythm, S1, S2 normal, no murmur, click, rub or gallop Abdomen: soft, non-tender; bowel sounds normal; no masses,  no organomegaly Extremities: extremities normal, atraumatic, no cyanosis or  edema Pulses: 2+ and symmetric Skin: Skin color, texture, turgor normal. No rashes or lesions Neurologic: Grossly normal Psych: Pleasant  EKG: Sinus rhythm (reviewed from Katelyn Hodges office)- personally reviewed  ASSESSMENT: 1. Paroxysmal tachycardia 2. Hot flashes 3. Hyperthermia/hyperhidrosis  PLAN: 1.   Katelyn Hodges is describing paroxysmal tachycardia and what sound like hot flashes.  These may be hormonally related.  Her thyroid function was normal.  She has intermittent hyperthermia as well.  Other possible causes could be other endocrine abnormalities or perhaps surging levels of estrogen and progesterone.  Finally, rare tumor such as a pheochromocytoma could give similar presentation however it is seemingly unlikely and her blood pressure is relatively normal.  I would recommend we a proceed with a 3-day ZIO monitor for her palpitations.  We will also obtain a coronary artery calcium score since she is asymptomatic although has a family history of early onset heart disease and could consider further re-stratification based on that, especially since she is a smoker.  We did discuss smoking cessation and she is not yet ready to quit.  Thanks for the kind referral.  Follow-up with me afterwards.  Katelyn Casino, MD, [facility], [facility] Director of the [facility]  [facility] Diplomate of the American Board of Clinical Lipidology Attending Cardiologist  Direct Dial: [phone]  Fax: [phone]  Website:  [email or web address] 01/01/2018, 2:04 PM

## 2018-01-19 ENCOUNTER — Other Ambulatory Visit: Payer: 59

## 2018-02-07 DIAGNOSIS — Z01419 Encounter for gynecological examination (general) (routine) without abnormal findings: Secondary | ICD-10-CM | POA: Diagnosis not present

## 2018-02-07 DIAGNOSIS — Z6822 Body mass index (BMI) 22.0-22.9, adult: Secondary | ICD-10-CM | POA: Diagnosis not present

## 2018-02-07 DIAGNOSIS — Z1231 Encounter for screening mammogram for malignant neoplasm of breast: Secondary | ICD-10-CM | POA: Diagnosis not present

## 2018-02-08 ENCOUNTER — Other Ambulatory Visit: Payer: Self-pay | Admitting: Obstetrics & Gynecology

## 2018-02-08 DIAGNOSIS — N6310 Unspecified lump in the right breast, unspecified quadrant: Secondary | ICD-10-CM

## 2018-02-09 ENCOUNTER — Ambulatory Visit (INDEPENDENT_AMBULATORY_CARE_PROVIDER_SITE_OTHER)
Admission: RE | Admit: 2018-02-09 | Discharge: 2018-02-09 | Disposition: A | Discharge status: Home / Self Care | Payer: Self-pay | Source: Ambulatory Visit | Attending: Internal Medicine | Admitting: Internal Medicine

## 2018-02-09 ENCOUNTER — Ambulatory Visit (INDEPENDENT_AMBULATORY_CARE_PROVIDER_SITE_OTHER): Payer: 59

## 2018-02-09 DIAGNOSIS — Z720 Tobacco use: Secondary | ICD-10-CM

## 2018-02-09 DIAGNOSIS — Z8249 Family history of ischemic heart disease and other diseases of the circulatory system: Secondary | ICD-10-CM

## 2018-02-09 DIAGNOSIS — R000 Tachycardia, unspecified: Secondary | ICD-10-CM

## 2018-02-09 MED FILL — ESCITALOPRAM 20 MG TABLET: 20 | 90 days supply | Qty: 90 | Fill #0

## 2018-02-14 ENCOUNTER — Other Ambulatory Visit: Payer: 59

## 2018-02-16 ENCOUNTER — Other Ambulatory Visit: Payer: 59

## 2018-02-21 DIAGNOSIS — R000 Tachycardia, unspecified: Secondary | ICD-10-CM | POA: Diagnosis not present

## 2018-02-26 ENCOUNTER — Telehealth: Payer: Self-pay

## 2018-02-26 NOTE — Telephone Encounter (Signed)
-----   Message from Pixie Casino, MD sent at 02/24/2018 10:45 AM EST ----- Sinus tachy - will d/w her at follow-up.  Dr. Lemmie Evens

## 2018-02-26 NOTE — Telephone Encounter (Signed)
Called to give pt cardiac monitor results and Dr.Hilty's recommendation. [email or web address].

## 2018-02-27 NOTE — Telephone Encounter (Signed)
Patient returned called, she stated to leave detailed message.

## 2018-02-27 NOTE — Telephone Encounter (Signed)
Pt advised monitor results and will call back when she is not at work to make an appointment.

## 2018-03-23 ENCOUNTER — Other Ambulatory Visit: Payer: 59

## 2018-03-30 ENCOUNTER — Other Ambulatory Visit: Payer: 59

## 2018-04-06 ENCOUNTER — Other Ambulatory Visit: Payer: 59

## 2018-05-16 MED FILL — ESCITALOPRAM 20 MG TABLET: 20 | 90 days supply | Qty: 90 | Fill #0

## 2018-05-18 ENCOUNTER — Ambulatory Visit: Payer: 59 | Admitting: Family Medicine

## 2018-06-01 ENCOUNTER — Other Ambulatory Visit: Payer: Self-pay

## 2018-06-01 ENCOUNTER — Ambulatory Visit
Admission: RE | Admit: 2018-06-01 | Discharge: 2018-06-01 | Disposition: A | Discharge status: Home / Self Care | Payer: 59 | Source: Ambulatory Visit | Attending: Obstetrics & Gynecology | Admitting: Obstetrics & Gynecology

## 2018-06-01 ENCOUNTER — Other Ambulatory Visit: Payer: Self-pay | Admitting: Obstetrics & Gynecology

## 2018-06-01 ENCOUNTER — Other Ambulatory Visit: Payer: 59

## 2018-06-01 DIAGNOSIS — N6310 Unspecified lump in the right breast, unspecified quadrant: Secondary | ICD-10-CM

## 2018-06-01 DIAGNOSIS — N6489 Other specified disorders of breast: Secondary | ICD-10-CM | POA: Diagnosis not present

## 2018-06-07 DIAGNOSIS — N921 Excessive and frequent menstruation with irregular cycle: Secondary | ICD-10-CM | POA: Diagnosis not present

## 2018-06-21 ENCOUNTER — Other Ambulatory Visit: Payer: Self-pay

## 2018-06-21 ENCOUNTER — Ambulatory Visit
Admission: RE | Admit: 2018-06-21 | Discharge: 2018-06-21 | Disposition: A | Discharge status: Home / Self Care | Payer: 59 | Source: Ambulatory Visit | Attending: Obstetrics & Gynecology | Admitting: Obstetrics & Gynecology

## 2018-06-21 DIAGNOSIS — N6489 Other specified disorders of breast: Secondary | ICD-10-CM | POA: Diagnosis not present

## 2018-06-21 DIAGNOSIS — N6310 Unspecified lump in the right breast, unspecified quadrant: Secondary | ICD-10-CM

## 2018-06-21 DIAGNOSIS — N6311 Unspecified lump in the right breast, upper outer quadrant: Secondary | ICD-10-CM | POA: Diagnosis not present

## 2018-06-22 ENCOUNTER — Other Ambulatory Visit: Payer: 59

## 2018-10-30 MED FILL — ESCITALOPRAM 20 MG TABLET: 20 | 90 days supply | Qty: 90 | Fill #0

## 2018-12-21 ENCOUNTER — Ambulatory Visit: Payer: Self-pay | Admitting: Surgery

## 2018-12-21 DIAGNOSIS — N6489 Other specified disorders of breast: Secondary | ICD-10-CM | POA: Diagnosis not present

## 2018-12-21 LAB — HM MAMMOGRAPHY

## 2018-12-21 NOTE — H&P (Signed)
Katelyn Hodges Documented: 12/21/2018 9:13 AM Location: [facility] Patient #: [id number] DOB: 07-10-1965 Divorced / Language: Katelyn Hodges / Race: White Female  History of Present Illness [name] [name] MD; 12/21/2018 11:00 AM) Patient words: Patient presents at the request of Dr. Rosana Hoes of the Breast Ctr[facility] due to a history of a right breast in the upper outer quadrant. She and her mammogram back in May 2020.On mammogram was noted in the right breast a mass 5 mm x 8 mm x 5 mm at about 9:30. Core biopsy showed a radial scar. No history of breast cancer Denies mass, pain , discharge or change in the appearance of either breast         Patient presents for palpable abnormality within the upper-outer right breast.  EXAM: DIGITAL DIAGNOSTIC RIGHT MAMMOGRAM WITH CAD AND TOMO  ULTRASOUND RIGHT BREAST On outer valley was noted in the right breast at about 9:30. Core biopsy showed a radial scar COMPARISON: Previous exam(s).  ACR Breast Density Category c: The breast tissue is heterogeneously dense, which may obscure small masses.  FINDINGS: Multiple oval circumscribed masses are demonstrated throughout the right breast compatible with cysts. There is a focal area of distortion within the upper-outer right breast underlying the palpable marker.  Mammographic images were processed with CAD.  On physical exam, I palpate a small mass within the upper-outer right breast.  Targeted ultrasound is performed, showing a 5 x 8 x 8 mm irregular hypoechoic mass right breast 9:30 o'clock 5 cm from the nipple at the site of palpable concern. No right axillary adenopathy.  IMPRESSION: Indeterminate right breast mass 9:30 o'clock.  RECOMMENDATION: Ultrasound-guided core needle biopsy right breast mass 9:30 o'clock.  I have discussed the findings and recommendations with the patient. Results were also provided in writing at the conclusion of the visit. If applicable,  a reminder letter will be sent to the patient regarding the next appointment.  BI-RADS CATEGORY 4: Suspicious.   Electronically Signed By: [name] M.D. On: 06/01/2018 16:13          Diagnosis Breast, right, needle core biopsy, 9:30 o'clock, 5cmfn - COMPLEX SCLEROSING LESION - SEE COMMENT.  The patient is a 53 year old female.   Past Surgical History (Katelyn Hodges, CMA; 12/21/2018 9:13 AM) Breast Biopsy Bilateral. Breast Mass; Local Excision Left. Oral Surgery Tonsillectomy  Diagnostic Studies History (Katelyn Hodges, [place]; 12/21/2018 9:13 AM) Mammogram within last year Pap Smear 1-5 years ago  Allergies (Katelyn Hodges, CMA; 12/21/2018 9:15 AM) No Known Drug Allergies [12/21/2018]:  Medication History (Katelyn Hodges, CMA; 12/21/2018 9:15 AM) Escitalopram Oxalate (10MG  Tablet, Oral) Active. Medications Reconciled  Social History (Katelyn Hodges, CMA; 12/21/2018 9:13 AM) Alcohol use Moderate alcohol use. Caffeine use Coffee. Illicit drug use Remotely quit drug use. Tobacco use Current every day smoker.  Family History (Katelyn Hodges, [place]; 12/21/2018 9:13 AM) Arthritis Mother. Breast Cancer Mother. Heart Disease Father. Hypertension Father, Mother. Kidney Disease Sister. Respiratory Condition Father. Thyroid problems Mother.  Pregnancy / Birth History (Katelyn Hodges, [place]; 12/21/2018 9:13 AM) Age at menarche 12 years. Age of menopause 51-55 Contraceptive History Oral contraceptives. Gravida 0 Irregular periods Para 0  Other Problems (Katelyn Hodges, CMA; 12/21/2018 9:13 AM) Anxiety Disorder Gastric Ulcer Gastroesophageal Reflux Disease Hypercholesterolemia Lump In Breast Sleep Apnea     Review of Systems (Katelyn Hodges CMA; 12/21/2018 9:13 AM) General Present- Night Sweats. Not Present- Appetite Loss, Chills, Fatigue, Fever, Weight Gain and Weight Loss. Skin Not Present- Change in Wart/Mole, Dryness, Hives,  Jaundice, New  Lesions, Non-Healing Wounds, Rash and Ulcer. HEENT Present- Wears glasses/contact lenses. Not Present- Earache, Hearing Loss, Hoarseness, Nose Bleed, Oral Ulcers, Ringing in the Ears, Seasonal Allergies, Sinus Pain, Sore Throat, Visual Disturbances and Yellow Eyes. Respiratory Present- Snoring. Not Present- Bloody sputum, Chronic Cough, Difficulty Breathing and Wheezing. Cardiovascular Present- Rapid Heart Rate. Not Present- Chest Pain, Difficulty Breathing Lying Down, Leg Cramps, Palpitations, Shortness of Breath and Swelling of Extremities. Gastrointestinal Present- Excessive gas. Not Present- Abdominal Pain, Bloating, Bloody Stool, Change in Bowel Habits, Chronic diarrhea, Constipation, Difficulty Swallowing, Gets full quickly at meals, Hemorrhoids, Indigestion, Nausea, Rectal Pain and Vomiting. Female Genitourinary Present- Frequency. Not Present- Nocturia, Painful Urination, Pelvic Pain and Urgency. Musculoskeletal Present- Joint Pain and Joint Stiffness. Not Present- Back Pain, Muscle Pain, Muscle Weakness and Swelling of Extremities. Neurological Not Present- Decreased Memory, Fainting, Headaches, Numbness, Seizures, Tingling, Tremor, Trouble walking and Weakness. Psychiatric Not Present- Anxiety, Bipolar, Change in Sleep Pattern, Depression, Fearful and Frequent crying. Endocrine Present- Heat Intolerance and Hot flashes. Not Present- Cold Intolerance, Excessive Hunger, Hair Changes and New Diabetes. Hematology Not Present- Blood Thinners, Easy Bruising, Excessive bleeding, Gland problems, HIV and Persistent Infections.  Vitals (Katelyn Hodges CMA; 12/21/2018 9:16 AM) 12/21/2018 9:15 AM Weight: 156.42 lb Temp.: 40F(Oral)  Pulse: 81 (Regular)  P.OX: 99% (Room air) BP: 146/80 (Sitting, Left Arm, Standard)        Physical Exam ([name] [name] MD; 12/21/2018 11:01 AM)  General Mental Status-Alert. General Appearance-Consistent with stated age. Hydration-Well  hydrated. Voice-Normal.  Eye Eyeball - Bilateral-Extraocular movements intact. Sclera/Conjunctiva - Bilateral-No scleral icterus.  Chest and Lung Exam Note: Work of breathing normal. Lung sounds clear  Breast Breast - Left-Symmetric, Non Tender, No Biopsy scars, no Dimpling - Left, No Inflammation, No Lumpectomy scars, No Mastectomy scars, No Peau d' Orange. Breast - Right-Symmetric, Non Tender, No Biopsy scars, no Dimpling - Right, No Inflammation, No Lumpectomy scars, No Mastectomy scars, No Peau d' Orange. Breast Lump-No Palpable Breast Mass.  Cardiovascular Note: Normal sinus rhythm  Neurologic Neurologic evaluation reveals -alert and oriented x 3 with no impairment of recent or remote memory. Mental Status-Normal.  Musculoskeletal Normal Exam - Left-Upper Extremity Strength Normal and Lower Extremity Strength Normal. Normal Exam - Right-Upper Extremity Strength Normal and Lower Extremity Strength Normal.  Lymphatic Head & Neck  General Head & Neck Lymphatics: Bilateral - Description - Normal. Axillary  General Axillary Region: Bilateral - Description - Normal. Tenderness - Non Tender.    Assessment & Plan (Levi Crass A. 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 Risk of lumpectomy include bleeding, infection, seroma, more surgery, use of seed/wire, wound care, cosmetic deformity and the need for other treatments, death , blood clots, death. Pt agrees to proceed.  Current Plans You are being scheduled for surgery- Our schedulers will call you.  You should hear from our office's scheduling department within 5 working days about the location, date, and time of surgery. We try to make accommodations for patient's preferences in scheduling surgery, but sometimes the OR schedule or the surgeon's schedule  prevents [place] from making those accommodations.  If you have not heard from our office [phone]) in 5 working days, call the office and ask for your surgeon's nurse.  If you have other questions about your diagnosis, plan, or surgery, call the office and ask for your surgeon's nurse.  Pt Education -  Pamphlet Given - Breast Biopsy: discussed with patient and provided information.

## 2018-12-27 ENCOUNTER — Other Ambulatory Visit: Payer: Self-pay | Admitting: Surgery

## 2018-12-27 DIAGNOSIS — N6489 Other specified disorders of breast: Secondary | ICD-10-CM

## 2019-01-19 ENCOUNTER — Other Ambulatory Visit (HOSPITAL_COMMUNITY): Payer: 59

## 2019-01-21 ENCOUNTER — Inpatient Hospital Stay: Admission: RE | Admit: 2019-01-21 | Payer: 59 | Source: Ambulatory Visit

## 2019-01-22 ENCOUNTER — Ambulatory Visit (HOSPITAL_BASED_OUTPATIENT_CLINIC_OR_DEPARTMENT_OTHER): Admission: RE | Admit: 2019-01-22 | Payer: 59 | Source: Home / Self Care | Admitting: Surgery

## 2019-01-22 ENCOUNTER — Encounter (HOSPITAL_BASED_OUTPATIENT_CLINIC_OR_DEPARTMENT_OTHER): Admission: RE | Payer: Self-pay | Source: Home / Self Care

## 2019-01-22 SURGERY — BREAST LUMPECTOMY WITH RADIOACTIVE SEED LOCALIZATION
Anesthesia: General | Site: Breast | Laterality: Right

## 2019-01-23 ENCOUNTER — Other Ambulatory Visit: Payer: Self-pay | Admitting: Family Medicine

## 2019-01-23 DIAGNOSIS — N644 Mastodynia: Secondary | ICD-10-CM

## 2019-03-08 ENCOUNTER — Other Ambulatory Visit: Payer: Self-pay

## 2019-03-08 ENCOUNTER — Encounter (HOSPITAL_BASED_OUTPATIENT_CLINIC_OR_DEPARTMENT_OTHER): Payer: Self-pay | Admitting: Surgery

## 2019-03-11 ENCOUNTER — Encounter (HOSPITAL_BASED_OUTPATIENT_CLINIC_OR_DEPARTMENT_OTHER)
Admission: RE | Admit: 2019-03-11 | Discharge: 2019-03-11 | Disposition: A | Discharge status: Home / Self Care | Payer: 59 | Source: Ambulatory Visit | Attending: Surgery | Admitting: Surgery

## 2019-03-11 ENCOUNTER — Other Ambulatory Visit: Payer: Self-pay

## 2019-03-11 ENCOUNTER — Other Ambulatory Visit (HOSPITAL_COMMUNITY)
Admission: RE | Admit: 2019-03-11 | Discharge: 2019-03-11 | Disposition: A | Discharge status: Home / Self Care | Payer: 59 | Source: Ambulatory Visit | Attending: Surgery | Admitting: Surgery

## 2019-03-11 DIAGNOSIS — N6489 Other specified disorders of breast: Secondary | ICD-10-CM | POA: Insufficient documentation

## 2019-03-11 DIAGNOSIS — Z20822 Contact with and (suspected) exposure to covid-19: Secondary | ICD-10-CM | POA: Insufficient documentation

## 2019-03-11 DIAGNOSIS — Z01812 Encounter for preprocedural laboratory examination: Secondary | ICD-10-CM | POA: Insufficient documentation

## 2019-03-11 LAB — CBC WITH DIFFERENTIAL/PLATELET
Abs Immature Granulocytes: 0.05 10*3/uL (ref 0.00–0.07)
Basophils Absolute: 0.1 10*3/uL (ref 0.0–0.1)
Basophils Relative: 1 %
Eosinophils Absolute: 0.2 10*3/uL (ref 0.0–0.5)
Eosinophils Relative: 2 %
HCT: 41.2 % (ref 36.0–46.0)
Hemoglobin: 13.6 g/dL (ref 12.0–15.0)
Immature Granulocytes: 1 %
Lymphocytes Relative: 27 %
Lymphs Abs: 2.5 10*3/uL (ref 0.7–4.0)
MCH: 29.2 pg (ref 26.0–34.0)
MCHC: 33 g/dL (ref 30.0–36.0)
MCV: 88.6 fL (ref 80.0–100.0)
Monocytes Absolute: 0.7 10*3/uL (ref 0.1–1.0)
Monocytes Relative: 8 %
Neutro Abs: 5.9 10*3/uL (ref 1.7–7.7)
Neutrophils Relative %: 61 %
Platelets: 295 10*3/uL (ref 150–400)
RBC: 4.65 MIL/uL (ref 3.87–5.11)
RDW: 13.7 % (ref 11.5–15.5)
WBC: 9.4 10*3/uL (ref 4.0–10.5)
nRBC: 0 % (ref 0.0–0.2)

## 2019-03-11 LAB — COMPREHENSIVE METABOLIC PANEL
ALT: 20 U/L (ref 0–44)
AST: 23 U/L (ref 15–41)
Albumin: 4 g/dL (ref 3.5–5.0)
Alkaline Phosphatase: 56 U/L (ref 38–126)
Anion gap: 11 (ref 5–15)
BUN: 14 mg/dL (ref 6–20)
CO2: 23 mmol/L (ref 22–32)
Calcium: 9.2 mg/dL (ref 8.9–10.3)
Chloride: 103 mmol/L (ref 98–111)
Creatinine, Ser: 0.87 mg/dL (ref 0.44–1.00)
GFR calc Af Amer: >60 mL/min (ref >60)
GFR calc non Af Amer: >60 mL/min (ref >60)
Glucose, Bld: 81 mg/dL (ref 70–99)
Potassium: 4.2 mmol/L (ref 3.5–5.1)
Sodium: 137 mmol/L (ref 135–145)
Total Bilirubin: 0.8 mg/dL (ref 0.3–1.2)
Total Protein: 6.3 g/dL — ABNORMAL LOW (ref 6.5–8.1)

## 2019-03-11 LAB — SARS CORONAVIRUS 2 (TAT 6-24 HRS): SARS Coronavirus 2: NEGATIVE

## 2019-03-11 NOTE — Progress Notes (Signed)

## 2019-03-13 ENCOUNTER — Ambulatory Visit
Admission: RE | Admit: 2019-03-13 | Discharge: 2019-03-13 | Disposition: A | Discharge status: Home / Self Care | Payer: 59 | Source: Ambulatory Visit | Attending: Surgery | Admitting: Surgery

## 2019-03-13 ENCOUNTER — Other Ambulatory Visit: Payer: Self-pay

## 2019-03-13 DIAGNOSIS — N6489 Other specified disorders of breast: Secondary | ICD-10-CM

## 2019-03-13 DIAGNOSIS — R928 Other abnormal and inconclusive findings on diagnostic imaging of breast: Secondary | ICD-10-CM | POA: Diagnosis not present

## 2019-03-14 ENCOUNTER — Ambulatory Visit (HOSPITAL_BASED_OUTPATIENT_CLINIC_OR_DEPARTMENT_OTHER): Payer: 59 | Admitting: Certified Registered"

## 2019-03-14 ENCOUNTER — Ambulatory Visit (HOSPITAL_BASED_OUTPATIENT_CLINIC_OR_DEPARTMENT_OTHER)
Admission: RE | Admit: 2019-03-14 | Discharge: 2019-03-14 | Disposition: A | Discharge status: Home / Self Care | Payer: 59 | Attending: Surgery | Admitting: Surgery

## 2019-03-14 ENCOUNTER — Encounter (HOSPITAL_BASED_OUTPATIENT_CLINIC_OR_DEPARTMENT_OTHER): Payer: Self-pay | Admitting: Surgery

## 2019-03-14 ENCOUNTER — Ambulatory Visit
Admission: RE | Admit: 2019-03-14 | Discharge: 2019-03-14 | Disposition: A | Discharge status: Home / Self Care | Payer: 59 | Source: Ambulatory Visit | Attending: Surgery | Admitting: Surgery

## 2019-03-14 ENCOUNTER — Other Ambulatory Visit: Payer: Self-pay

## 2019-03-14 ENCOUNTER — Encounter (HOSPITAL_BASED_OUTPATIENT_CLINIC_OR_DEPARTMENT_OTHER)
Admission: RE | Disposition: A | Discharge status: Home / Self Care | Payer: Self-pay | Source: Home / Self Care | Attending: Surgery

## 2019-03-14 DIAGNOSIS — L905 Scar conditions and fibrosis of skin: Secondary | ICD-10-CM | POA: Diagnosis not present

## 2019-03-14 DIAGNOSIS — N6081 Other benign mammary dysplasias of right breast: Secondary | ICD-10-CM | POA: Diagnosis not present

## 2019-03-14 DIAGNOSIS — F17200 Nicotine dependence, unspecified, uncomplicated: Secondary | ICD-10-CM | POA: Diagnosis not present

## 2019-03-14 DIAGNOSIS — N6489 Other specified disorders of breast: Secondary | ICD-10-CM | POA: Diagnosis not present

## 2019-03-14 DIAGNOSIS — N6011 Diffuse cystic mastopathy of right breast: Secondary | ICD-10-CM | POA: Diagnosis not present

## 2019-03-14 DIAGNOSIS — N62 Hypertrophy of breast: Secondary | ICD-10-CM | POA: Diagnosis not present

## 2019-03-14 DIAGNOSIS — R519 Headache, unspecified: Secondary | ICD-10-CM | POA: Insufficient documentation

## 2019-03-14 DIAGNOSIS — K219 Gastro-esophageal reflux disease without esophagitis: Secondary | ICD-10-CM | POA: Insufficient documentation

## 2019-03-14 DIAGNOSIS — R928 Other abnormal and inconclusive findings on diagnostic imaging of breast: Secondary | ICD-10-CM | POA: Diagnosis not present

## 2019-03-14 DIAGNOSIS — F419 Anxiety disorder, unspecified: Secondary | ICD-10-CM | POA: Insufficient documentation

## 2019-03-14 DIAGNOSIS — N6091 Unspecified benign mammary dysplasia of right breast: Secondary | ICD-10-CM | POA: Diagnosis not present

## 2019-03-14 DIAGNOSIS — E559 Vitamin D deficiency, unspecified: Secondary | ICD-10-CM | POA: Diagnosis not present

## 2019-03-14 HISTORY — DX: Nicotine dependence, unspecified, uncomplicated: F17.200

## 2019-03-14 HISTORY — DX: Unspecified lump in the right breast, unspecified quadrant: N63.10

## 2019-03-14 HISTORY — PX: BREAST EXCISIONAL BIOPSY: SUR124

## 2019-03-14 HISTORY — PX: BREAST LUMPECTOMY WITH RADIOACTIVE SEED LOCALIZATION: SHX6424

## 2019-03-14 HISTORY — DX: Anxiety disorder, unspecified: F41.9

## 2019-03-14 LAB — POCT PREGNANCY, URINE: Preg Test, Ur: NEGATIVE

## 2019-03-14 SURGERY — BREAST LUMPECTOMY WITH RADIOACTIVE SEED LOCALIZATION
Anesthesia: General | Site: Breast | Laterality: Right

## 2019-03-14 MED ORDER — CEFAZOLIN SODIUM-DEXTROSE 2-4 GM/100ML-% IV SOLN
INTRAVENOUS | Status: AC
  Filled 2019-03-14: qty 100

## 2019-03-14 MED ORDER — PHENYLEPHRINE 40 MCG/ML (10ML) SYRINGE FOR IV PUSH (FOR BLOOD PRESSURE SUPPORT)
PREFILLED_SYRINGE | INTRAVENOUS | Status: AC
  Filled 2019-03-14: qty 10

## 2019-03-14 MED ORDER — LACTATED RINGERS IV SOLN: INTRAVENOUS | Status: DC

## 2019-03-14 MED ORDER — FENTANYL CITRATE (PF) 100 MCG/2ML IJ SOLN
INTRAMUSCULAR | Status: AC
  Filled 2019-03-14: qty 2

## 2019-03-14 MED ORDER — EPHEDRINE SULFATE 50 MG/ML IJ SOLN
INTRAMUSCULAR | Status: DC | PRN
  Administered 2019-03-14: 25 mg via INTRAVENOUS

## 2019-03-14 MED ORDER — LIDOCAINE 2% (20 MG/ML) 5 ML SYRINGE
INTRAMUSCULAR | Status: DC | PRN
  Administered 2019-03-14: 50 mg via INTRAVENOUS

## 2019-03-14 MED ORDER — LIDOCAINE 2% (20 MG/ML) 5 ML SYRINGE
INTRAMUSCULAR | Status: AC
  Filled 2019-03-14: qty 5

## 2019-03-14 MED ORDER — OXYCODONE HCL 5 MG/5ML PO SOLN: 5.0000 mg | Freq: Once | ORAL | Status: DC | PRN

## 2019-03-14 MED ORDER — ONDANSETRON HCL 4 MG/2ML IJ SOLN
INTRAMUSCULAR | Status: AC
  Filled 2019-03-14: qty 2

## 2019-03-14 MED ORDER — ACETAMINOPHEN 500 MG PO TABS
1000.0000 mg | ORAL_TABLET | ORAL | Status: AC
  Administered 2019-03-14: 07:00:00 1000 mg via ORAL

## 2019-03-14 MED ORDER — PROMETHAZINE HCL 25 MG/ML IJ SOLN: 6.2500 mg | INTRAMUSCULAR | Status: DC | PRN

## 2019-03-14 MED ORDER — PROPOFOL 10 MG/ML IV BOLUS
INTRAVENOUS | Status: AC
  Filled 2019-03-14: qty 20

## 2019-03-14 MED ORDER — GABAPENTIN 300 MG PO CAPS
300.0000 mg | ORAL_CAPSULE | ORAL | Status: AC
  Administered 2019-03-14: 07:00:00 300 mg via ORAL

## 2019-03-14 MED ORDER — IBUPROFEN 800 MG PO TABS: 800.0000 mg | ORAL_TABLET | Freq: Three times a day (TID) | ORAL | 0 refills | Status: DC | PRN

## 2019-03-14 MED ORDER — MIDAZOLAM HCL 5 MG/5ML IJ SOLN
INTRAMUSCULAR | Status: DC | PRN
  Administered 2019-03-14: 2 mg via INTRAVENOUS

## 2019-03-14 MED ORDER — CELECOXIB 200 MG PO CAPS
200.0000 mg | ORAL_CAPSULE | ORAL | Status: AC
  Administered 2019-03-14: 200 mg via ORAL

## 2019-03-14 MED ORDER — DEXAMETHASONE SODIUM PHOSPHATE 4 MG/ML IJ SOLN
INTRAMUSCULAR | Status: DC | PRN
  Administered 2019-03-14: 10 mg via INTRAVENOUS

## 2019-03-14 MED ORDER — DEXAMETHASONE SODIUM PHOSPHATE 10 MG/ML IJ SOLN
INTRAMUSCULAR | Status: AC
  Filled 2019-03-14: qty 1

## 2019-03-14 MED ORDER — ONDANSETRON HCL 4 MG/2ML IJ SOLN
INTRAMUSCULAR | Status: DC | PRN
  Administered 2019-03-14: 4 mg via INTRAVENOUS

## 2019-03-14 MED ORDER — PROPOFOL 10 MG/ML IV BOLUS
INTRAVENOUS | Status: DC | PRN
  Administered 2019-03-14: 50 mg via INTRAVENOUS
  Administered 2019-03-14: 150 mg via INTRAVENOUS

## 2019-03-14 MED ORDER — SODIUM CHLORIDE 0.9 % IR SOLN
Status: DC | PRN
  Administered 2019-03-14: 50 mL

## 2019-03-14 MED ORDER — MIDAZOLAM HCL 2 MG/2ML IJ SOLN
INTRAMUSCULAR | Status: AC
  Filled 2019-03-14: qty 2

## 2019-03-14 MED ORDER — MEPERIDINE HCL 25 MG/ML IJ SOLN: 6.2500 mg | INTRAMUSCULAR | Status: DC | PRN

## 2019-03-14 MED ORDER — FENTANYL CITRATE (PF) 100 MCG/2ML IJ SOLN: 50.0000 ug | INTRAMUSCULAR | Status: DC | PRN

## 2019-03-14 MED ORDER — BUPIVACAINE-EPINEPHRINE (PF) 0.25% -1:200000 IJ SOLN
INTRAMUSCULAR | Status: DC | PRN
  Administered 2019-03-14: 30 mL via PERINEURAL

## 2019-03-14 MED ORDER — ACETAMINOPHEN 500 MG PO TABS
ORAL_TABLET | ORAL | Status: AC
  Filled 2019-03-14: qty 2

## 2019-03-14 MED ORDER — CHLORHEXIDINE GLUCONATE CLOTH 2 % EX PADS: 6.0000 | MEDICATED_PAD | Freq: Once | CUTANEOUS | Status: DC

## 2019-03-14 MED ORDER — ACETAMINOPHEN 10 MG/ML IV SOLN: 1000.0000 mg | Freq: Once | INTRAVENOUS | Status: DC | PRN

## 2019-03-14 MED ORDER — GABAPENTIN 300 MG PO CAPS
ORAL_CAPSULE | ORAL | Status: AC
  Filled 2019-03-14: qty 1

## 2019-03-14 MED ORDER — HYDROCODONE-ACETAMINOPHEN 5-325 MG PO TABS: 1.0000 | ORAL_TABLET | Freq: Four times a day (QID) | ORAL | 0 refills | Status: DC | PRN

## 2019-03-14 MED ORDER — ACETAMINOPHEN 325 MG PO TABS: 325.0000 mg | ORAL_TABLET | Freq: Once | ORAL | Status: DC | PRN

## 2019-03-14 MED ORDER — CEFAZOLIN SODIUM-DEXTROSE 2-4 GM/100ML-% IV SOLN
2.0000 g | INTRAVENOUS | Status: AC
  Administered 2019-03-14: 2 g via INTRAVENOUS

## 2019-03-14 MED ORDER — CELECOXIB 200 MG PO CAPS
ORAL_CAPSULE | ORAL | Status: AC
  Filled 2019-03-14: qty 1

## 2019-03-14 MED ORDER — FENTANYL CITRATE (PF) 100 MCG/2ML IJ SOLN
INTRAMUSCULAR | Status: DC | PRN
  Administered 2019-03-14: 100 ug via INTRAVENOUS

## 2019-03-14 MED ORDER — ACETAMINOPHEN 160 MG/5ML PO SOLN: 325.0000 mg | Freq: Once | ORAL | Status: DC | PRN

## 2019-03-14 MED ORDER — EPHEDRINE 5 MG/ML INJ
INTRAVENOUS | Status: AC
  Filled 2019-03-14: qty 10

## 2019-03-14 MED ORDER — OXYCODONE HCL 5 MG PO TABS: 5.0000 mg | ORAL_TABLET | Freq: Once | ORAL | Status: DC | PRN

## 2019-03-14 MED ORDER — MIDAZOLAM HCL 2 MG/2ML IJ SOLN: 1.0000 mg | INTRAMUSCULAR | Status: DC | PRN

## 2019-03-14 MED ORDER — FENTANYL CITRATE (PF) 100 MCG/2ML IJ SOLN: 25.0000 ug | INTRAMUSCULAR | Status: DC | PRN

## 2019-03-14 MED ORDER — BUPIVACAINE HCL (PF) 0.25 % IJ SOLN
INTRAMUSCULAR | Status: AC
  Filled 2019-03-14: qty 30

## 2019-03-14 SURGICAL SUPPLY — 43 items
BINDER BREAST XLRG (GAUZE/BANDAGES/DRESSINGS) ×2 IMPLANT
BLADE SURG 15 STRL LF DISP TIS (BLADE) ×1 IMPLANT
BLADE SURG 15 STRL SS (BLADE) ×1
CANISTER SUC SOCK COL 7IN (MISCELLANEOUS) IMPLANT
CHLORAPREP W/TINT 26 (MISCELLANEOUS) ×2 IMPLANT
COVER BACK TABLE 60X90IN (DRAPES) ×2 IMPLANT
COVER MAYO STAND STRL (DRAPES) ×2 IMPLANT
COVER PROBE W GEL 5X96 (DRAPES) ×2 IMPLANT
DECANTER SPIKE VIAL GLASS SM (MISCELLANEOUS) ×2 IMPLANT
DERMABOND ADVANCED (GAUZE/BANDAGES/DRESSINGS) ×1
DERMABOND ADVANCED .7 DNX12 (GAUZE/BANDAGES/DRESSINGS) ×1 IMPLANT
DRAPE LAPAROTOMY 100X72 PEDS (DRAPES) ×2 IMPLANT
DRAPE UTILITY XL STRL (DRAPES) ×2 IMPLANT
ELECT COATED BLADE 2.86 ST (ELECTRODE) ×2 IMPLANT
ELECT REM PT RETURN 9FT ADLT (ELECTROSURGICAL) ×2
ELECTRODE REM PT RTRN 9FT ADLT (ELECTROSURGICAL) ×1 IMPLANT
GLOVE BIO SURGEON STRL SZ 6.5 (GLOVE) ×2 IMPLANT
GLOVE BIOGEL M 6.5 STRL (GLOVE) ×2 IMPLANT
GLOVE BIOGEL PI IND STRL 7.0 (GLOVE) ×2 IMPLANT
GLOVE BIOGEL PI IND STRL 8 (GLOVE) ×1 IMPLANT
GLOVE BIOGEL PI INDICATOR 7.0 (GLOVE) ×2
GLOVE BIOGEL PI INDICATOR 8 (GLOVE) ×1
GLOVE ECLIPSE 7.0 STRL STRAW (GLOVE) ×2 IMPLANT
GLOVE ECLIPSE 8.0 STRL XLNG CF (GLOVE) ×2 IMPLANT
GOWN STRL REUS W/ TWL LRG LVL3 (GOWN DISPOSABLE) ×1 IMPLANT
GOWN STRL REUS W/TWL LRG LVL3 (GOWN DISPOSABLE) ×1
GOWN STRL REUS W/TWL XL LVL3 (GOWN DISPOSABLE) ×4 IMPLANT
HEMOSTAT ARISTA ABSORB 3G PWDR (HEMOSTASIS) IMPLANT
HEMOSTAT SNOW SURGICEL 2X4 (HEMOSTASIS) IMPLANT
KIT MARKER MARGIN INK (KITS) ×2 IMPLANT
NEEDLE HYPO 25X1 1.5 SAFETY (NEEDLE) ×2 IMPLANT
NS IRRIG 1000ML POUR BTL (IV SOLUTION) ×2 IMPLANT
PACK BASIN DAY SURGERY FS (CUSTOM PROCEDURE TRAY) ×2 IMPLANT
PENCIL SMOKE EVACUATOR (MISCELLANEOUS) ×2 IMPLANT
SLEEVE SCD COMPRESS KNEE MED (MISCELLANEOUS) ×2 IMPLANT
SUT MNCRL AB 4-0 PS2 18 (SUTURE) ×2 IMPLANT
SUT SILK 2 0 SH (SUTURE) IMPLANT
SUT VICRYL 3-0 CR8 SH (SUTURE) ×2 IMPLANT
SYR BULB IRRIGATION 50ML (SYRINGE) ×2 IMPLANT
SYR CONTROL 10ML LL (SYRINGE) ×2 IMPLANT
TOWEL GREEN STERILE FF (TOWEL DISPOSABLE) ×2 IMPLANT
TRAY FAXITRON CT DISP (TRAY / TRAY PROCEDURE) ×2 IMPLANT
YANKAUER SUCT BULB TIP NO VENT (SUCTIONS) IMPLANT

## 2019-03-14 NOTE — H&P (Signed)
Katelyn Hodges Location: [facility]  Patient #: [id number]  DOB: 02/24/1965  Divorced / Language: [name] / Race: White  Female  History of Present Illness [name] A. Leng Montesdeoca MD Patient words: Patient presents at the request of Dr. Rosana Hoes of the Breast Ctr[facility] due to a history of a right breast in the upper outer quadrant. She and her mammogram back in May 2020.On mammogram was noted in the right breast a mass 5 mm x 8 mm x 5 mm at about 9:30. Core biopsy showed a radial scar. No history of breast cancer  Denies mass, pain , discharge or change in the appearance of either breast  Patient presents for palpable abnormality within the  upper-outer right breast.  EXAM:  DIGITAL DIAGNOSTIC RIGHT MAMMOGRAM WITH CAD AND TOMO  ULTRASOUND RIGHT BREAST  On outer valley was noted in the right breast at about 9:30. Core biopsy showed a radial scar  COMPARISON: Previous exam(s).  ACR Breast Density Category c: The breast tissue is heterogeneously  dense, which may obscure small masses.  FINDINGS:  Multiple oval circumscribed masses are demonstrated throughout the  right breast compatible with cysts. There is a focal area of  distortion within the upper-outer right breast underlying the  palpable marker.  Mammographic images were processed with CAD.  On physical exam, I palpate a small mass within the upper-outer  right breast.  Targeted ultrasound is performed, showing a 5 x 8 x 8 mm irregular  hypoechoic mass right breast 9:30 o'clock 5 cm from the nipple at  the site of palpable concern. No right axillary adenopathy.  IMPRESSION:  Indeterminate right breast mass 9:30 o'clock.  RECOMMENDATION:  Ultrasound-guided core needle biopsy right breast mass 9:30 o'clock.  I have discussed the findings and recommendations with the patient.  Results were also provided in writing at the conclusion of the  visit. If applicable, a reminder letter will be sent to the patient  regarding the  next appointment.  BI-RADS CATEGORY 4: Suspicious.  Electronically Signed  By: [name] M.D.  On: 06/01/2018 16:13  Diagnosis  Breast, right, needle core biopsy, 9:30 o'clock, 5cmfn  - COMPLEX SCLEROSING LESION  - SEE COMMENT.  The patient is a 54 year old female.  Past Surgical History ([name],  Breast Biopsy Bilateral.  Breast Mass; Local Excision Left.  Oral Surgery  Tonsillectomy  Diagnostic Studies History ([name],)  Mammogram within last year  Pap Smear 1-5 years ago  Allergies ([name], CMA; 12/21/2018 9:15 AM)  No Known Drug Allergies [12/21/2018]:  Medication History ([name], CMA; Escitalopram Oxalate (10MG  Tablet, Oral) Active.  Medications Reconciled  Social History ([name], CMA; )  Alcohol use Moderate alcohol use.  Caffeine use Coffee.  Illicit drug use Remotely quit drug use.  Tobacco use Current every day smoker.  Family History ([name], [place]; Arthritis Mother.  Breast Cancer Mother.  Heart Disease Father.  Hypertension Father, Mother.  Kidney Disease Sister.  Respiratory Condition Father.  Thyroid problems Mother.  Pregnancy / Birth History ([name], [place];   Age at menarche 30 years.  Age of menopause 51-55  Contraceptive History Oral contraceptives.  Gravida 0  Irregular periods  Para 0  Other Problems ([name], [place]; Anxiety Disorder  Gastric Ulcer  Gastroesophageal Reflux Disease  Hypercholesterolemia  Lump In Breast  Sleep Apnea  Review of Systems ([name] CMA; General Present- Night Sweats. Not Present- Appetite Loss, Chills, Fatigue, Fever, Weight Gain and Weight Loss.  Skin Not  Present- Change in Wart/Mole, Dryness, Hives, Jaundice, New Lesions, Non-Healing Wounds, Rash and Ulcer.  HEENT Present- Wears glasses/contact lenses. Not Present- Earache, Hearing Loss, Hoarseness, Nose Bleed, Oral Ulcers, Ringing in the Ears, Seasonal Allergies, Sinus Pain, Sore Throat, Visual Disturbances and Yellow  Eyes.  Respiratory Present- Snoring. Not Present- Bloody sputum, Chronic Cough, Difficulty Breathing and Wheezing.  Cardiovascular Present- Rapid Heart Rate. Not Present- Chest Pain, Difficulty Breathing Lying Down, Leg Cramps, Palpitations, Shortness of Breath and Swelling of Extremities.  Gastrointestinal Present- Excessive gas. Not Present- Abdominal Pain, Bloating, Bloody Stool, Change in Bowel Habits, Chronic diarrhea, Constipation, Difficulty Swallowing, Gets full quickly at meals, Hemorrhoids, Indigestion, Nausea, Rectal Pain and Vomiting.  Female Genitourinary Present- Frequency. Not Present- Nocturia, Painful Urination, Pelvic Pain and Urgency.  Musculoskeletal Present- Joint Pain and Joint Stiffness. Not Present- Back Pain, Muscle Pain, Muscle Weakness and Swelling of Extremities.  Neurological Not Present- Decreased Memory, Fainting, Headaches, Numbness, Seizures, Tingling, Tremor, Trouble walking and Weakness.  Psychiatric Not Present- Anxiety, Bipolar, Change in Sleep Pattern, Depression, Fearful and Frequent crying.  Endocrine Present- Heat Intolerance and Hot flashes. Not Present- Cold Intolerance, Excessive Hunger, Hair Changes and New Diabetes.  Hematology Not Present- Blood Thinners, Easy Bruising, Excessive bleeding, Gland problems, HIV and Persistent Infections.  Vitals ([name] CMA;   Weight: 156.42 lb  Temp.: 97 F (Oral) Pulse: 81 (Regular) P.OX: 99% (Room air)  BP: 146/80 (Sitting, Left Arm, Standard)  Physical Exam (Gregery Walberg A. Brandy Zuba MD; General  Mental Status - Alert.  General Appearance - Consistent with stated age.  Hydration - Well hydrated.  Voice - Normal.  Eye  Eyeball - Bilateral - Extraocular movements intact.  Sclera/Conjunctiva - Bilateral - No scleral icterus.  Chest and Lung Exam  Note: Work of breathing normal. Lung sounds clear  Breast  Breast - Left - Symmetric, Non Tender, No Biopsy scars, no Dimpling - Left, No Inflammation, No Lumpectomy  scars, No Mastectomy scars, No Peau d' Orange.  Breast - Right - Symmetric, Non Tender, No Biopsy scars, no Dimpling - Right, No Inflammation, No Lumpectomy scars, No Mastectomy scars, No Peau d' Orange.  Breast Lump - No Palpable Breast Mass.  Cardiovascular  Note: Normal sinus rhythm  Neurologic  Neurologic evaluation reveals - alert and oriented x 3 with no impairment of recent or remote memory.  Mental Status - Normal.  Musculoskeletal  Normal Exam - Left - Upper Extremity Strength Normal and Lower Extremity Strength Normal.  Normal Exam - Right - Upper Extremity Strength Normal and Lower Extremity Strength Normal.  Lymphatic  Head & Neck  General Head & Neck Lymphatics: Bilateral - Description - Normal.  Axillary  General Axillary Region: Bilateral - Description - Normal. Tenderness - Non Tender.  Assessment & Plan (Davis Vannatter A. 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 Risk of lumpectomy include bleeding, infection, seroma, more surgery, use of seed/wire, wound care, cosmetic deformity and the need for other treatments, death , blood clots, death. Pt agrees to proceed.  Current Plans  You are being scheduled for surgery - Our schedulers will call you.  You should hear from our office's scheduling department within 5 working days about the location, date, and time of surgery. We try to make accommodations for patient's preferences in scheduling surgery, but sometimes the OR schedule or the surgeon's schedule prevents [place] from making those accommodations.  If you  have not heard from our office [phone]) in 5 working days, call the office and ask for your surgeon's nurse.  If you have other questions about your diagnosis, plan, or surgery, call the office and ask for your surgeon's nurse.  Pt Education - Pamphlet Given - Breast Biopsy: discussed with patient  and provided information

## 2019-03-14 NOTE — Discharge Instructions (Signed)
[place] Office Phone Number [phone]  BREAST BIOPSY/ PARTIAL MASTECTOMY: POST OP INSTRUCTIONS  Always review your discharge instruction sheet given to you by the facility where your surgery was performed.  IF YOU HAVE DISABILITY OR FAMILY LEAVE FORMS, YOU MUST BRING THEM TO THE OFFICE FOR PROCESSING.  DO NOT GIVE THEM TO YOUR DOCTOR.  1. A prescription for pain medication may be given to you upon discharge.  Take your pain medication as prescribed, if needed.  If narcotic pain medicine is not needed, then you may take acetaminophen (Tylenol) or ibuprofen (Advil) as needed. 2. Take your usually prescribed medications unless otherwise directed 3. If you need a refill on your pain medication, please contact your pharmacy.  They will contact our office to request authorization.  Prescriptions will not be filled after 5pm or on week-ends. 4. You should eat very light the first 24 hours after surgery, such as soup, crackers, pudding, etc.  Resume your normal diet the day after surgery. 5. Most patients will experience some swelling and bruising in the breast.  Ice packs and a good support bra will help.  Swelling and bruising can take several days to resolve.  6. It is common to experience some constipation if taking pain medication after surgery.  Increasing fluid intake and taking a stool softener will usually help or prevent this problem from occurring.  A mild laxative (Milk of Magnesia or Miralax) should be taken according to package directions if there are no bowel movements after 48 hours. 7. Unless discharge instructions indicate otherwise, you may remove your bandages 24-48 hours after surgery, and you may shower at that time.  You may have steri-strips (small skin tapes) in place directly over the incision.  These strips should be left on the skin for 7-10 days.  If your surgeon used skin glue on the incision, you may shower in 24 hours.  The glue will flake off over the  next 2-3 weeks.  Any sutures or staples will be removed at the office during your follow-up visit. 8. ACTIVITIES:  You may resume regular daily activities (gradually increasing) beginning the next day.  Wearing a good support bra or sports bra minimizes pain and swelling.  You may have sexual intercourse when it is comfortable. a. You may drive when you no longer are taking prescription pain medication, you can comfortably wear a seatbelt, and you can safely maneuver your car and apply brakes. b. RETURN TO WORK:  ______________________________________________________________________________________ 9. You should see your doctor in the office for a follow-up appointment approximately two weeks after your surgery.  Your doctor's nurse will typically make your follow-up appointment when she calls you with your pathology report.  Expect your pathology report 2-3 business days after your surgery.  You may call to check if you do not hear from [place] after three days. 10. OTHER INSTRUCTIONS: _______________________________________________________________________________________________ _____________________________________________________________________________________________________________________________________ _____________________________________________________________________________________________________________________________________ _____________________________________________________________________________________________________________________________________  WHEN TO CALL YOUR DOCTOR: 1. Fever over 101.0 2. Nausea and/or vomiting. 3. Extreme swelling or bruising. 4. Continued bleeding from incision. 5. Increased pain, redness, or drainage from the incision.  The clinic staff is available to answer your questions during regular business hours.  Please don't hesitate to call and ask to speak to one of the nurses for clinical concerns.  If you have a medical emergency, go to the nearest  emergency room or call 911.  A surgeon from [facility] is always on call at the hospital.  For further questions, please visit [email or web address]  Post Anesthesia Home Care Instructions  Activity: Get plenty of rest for the remainder of the day. A responsible individual must stay with you for 24 hours following the procedure.  For the next 24 hours, DO NOT: -Drive a car -Paediatric nurse -Drink alcoholic beverages -Take any medication unless instructed by your physician -Make any legal decisions or sign important papers.  Meals: Start with liquid foods such as gelatin or soup. Progress to regular foods as tolerated. Avoid greasy, spicy, heavy foods. If nausea and/or vomiting occur, drink only clear liquids until the nausea and/or vomiting subsides. Call your physician if vomiting continues.  Special Instructions/Symptoms: Your throat may feel dry or sore from the anesthesia or the breathing tube placed in your throat during surgery. If this causes discomfort, gargle with warm salt water. The discomfort should disappear within 24 hours.  If you had a scopolamine patch placed behind your ear for the management of post- operative nausea and/or vomiting:  1. The medication in the patch is effective for 72 hours, after which it should be removed.  Wrap patch in a tissue and discard in the trash. Wash hands thoroughly with soap and water. 2. You may remove the patch earlier than 72 hours if you experience unpleasant side effects which may include dry mouth, dizziness or visual disturbances. 3. Avoid touching the patch. Wash your hands with soap and water after contact with the patch.     *May take Tylenol at 12:30pm *May take Ibuprofen at 2:30pm

## 2019-03-14 NOTE — Op Note (Signed)
Preoperative diagnosis: Right breast radial scar  Postoperative diagnosis: Same  Procedure: Right breast seed localized lumpectomy  Surgeon: Erroll Luna, MD  Anesthesia: General with 0.25% Marcaine plain  EBL: Minimal  Specimen: Right breast tissue with seed and clip verified by Faxitron  Drains: None  IV fluids: Per anesthesia record  Indications for procedure: The patient is a 54 year old female who had a screen detected mammographic abnormality.  His core biopsy proven to be radial scar.  We discussed pros and cons of excision versus observation.  We discussed potential upgrade malignant risks and impact on future imaging.The procedure has been discussed with the patient. Alternatives to surgery have been discussed with the patient.  Risks of surgery include bleeding,  Infection,  Seroma formation, death,  and the need for further surgery.   The patient understands and wishes to proceed.  Description of procedure: The patient was met in the holding area.  The right side was marked with the assistance as the correct side.  Neoprobe used to verified seed activity and location right breast upper outer quadrant.  Questions were answered.  She was taken back to the operative room.  She is placed supine upon the OR table.  After induction of general esthesia, right breast was prepped and draped in sterile fashion timeout performed.  Proper patient, site and procedure were verified.  Neoprobe used to identify the seed in the right breast upper outer quadrant.  Curvilinear incision was made over the signal.  Dissection was carried down all tissue around the seed and clip were excised with a grossly negative margin.  Hemostasis achieved with cautery.  Wound closed with 3-0 Vicryl and 4 Monocryl.  Dermabond applied.  All counts were found to be correct.  The patient was awoke extubated taken recovery in satisfactory condition.

## 2019-03-14 NOTE — Anesthesia Preprocedure Evaluation (Addendum)
Anesthesia Evaluation  Patient identified by MRN, date of birth, ID band Patient awake    Reviewed: Allergy & Precautions, NPO status , Patient's Chart, lab work & pertinent test results  Airway Mallampati: I  TM Distance: >3 FB Neck ROM: Full    Dental  (+) Teeth Intact, Dental Advisory Given   Pulmonary Current Smoker and Patient abstained from smoking.,    breath sounds clear to auscultation       Cardiovascular negative cardio ROS   Rhythm:Regular Rate:Normal     Neuro/Psych  Headaches, Anxiety    GI/Hepatic Neg liver ROS, GERD  ,  Endo/Other  negative endocrine ROS  Renal/GU negative Renal ROS     Musculoskeletal negative musculoskeletal ROS (+)   Abdominal Normal abdominal exam  (+)   Peds  Hematology negative hematology ROS (+)   Anesthesia Other Findings   Reproductive/Obstetrics                            Anesthesia Physical Anesthesia Plan  ASA: II  Anesthesia Plan: General   Post-op Pain Management:    Induction: Intravenous  PONV Risk Score and Plan: 3 and Ondansetron, Dexamethasone and Midazolam  Airway Management Planned: LMA  Additional Equipment: None  Intra-op Plan:   Post-operative Plan: Extubation in OR  Informed Consent: I have reviewed the patients History and Physical, chart, labs and discussed the procedure including the risks, benefits and alternatives for the proposed anesthesia with the patient or authorized representative who has indicated his/her understanding and acceptance.     Dental advisory given  Plan Discussed with: CRNA  Anesthesia Plan Comments:        Anesthesia Quick Evaluation

## 2019-03-14 NOTE — Transfer of Care (Signed)
Immediate Anesthesia Transfer of Care Note  Patient: Katelyn Hodges  Procedure(s) Performed: RIGHT BREAST LUMPECTOMY WITH RADIOACTIVE SEED LOCALIZATION (Right Breast)  Patient Location: PACU  Anesthesia Type:General  Level of Consciousness: awake and patient cooperative  Airway & Oxygen Therapy: Patient Spontanous Breathing and Patient connected to face mask oxygen  Post-op Assessment: Report given to RN and Post -op Vital signs reviewed and stable  Post vital signs: Reviewed and stable  Last Vitals:  Vitals Value Taken Time  BP    Temp    Pulse    Resp    SpO2      Last Pain:  Vitals:   03/14/19 0626  TempSrc: Tympanic  PainSc: 0-No pain      Patients Stated Pain Goal: 8 ([id number])  Complications: No apparent anesthesia complications

## 2019-03-14 NOTE — Anesthesia Postprocedure Evaluation (Signed)
Anesthesia Post Note  Patient: Katelyn Hodges  Procedure(s) Performed: RIGHT BREAST LUMPECTOMY WITH RADIOACTIVE SEED LOCALIZATION (Right Breast)     Patient location during evaluation: PACU Anesthesia Type: General Level of consciousness: awake and alert Pain management: pain level controlled Vital Signs Assessment: post-procedure vital signs reviewed and stable Respiratory status: spontaneous breathing, nonlabored ventilation, respiratory function stable and patient connected to nasal cannula oxygen Cardiovascular status: blood pressure returned to baseline and stable Postop Assessment: no apparent nausea or vomiting Anesthetic complications: no    Last Vitals:  Vitals:   03/14/19 0845 03/14/19 0915  BP: 110/61 127/65  Pulse: 79 77  Resp: 16 15  Temp: 36.6 C 36.6 C  SpO2: 97% 100%    Last Pain:  Vitals:   03/14/19 0915  TempSrc:   PainSc: [place] Jahkai Yandell

## 2019-03-14 NOTE — Interval H&P Note (Signed)
History and Physical Interval Note:  03/14/2019 7:21 AM  Katelyn Hodges  has presented today for surgery, with the diagnosis of right breast radial scar.  The various methods of treatment have been discussed with the patient and family. After consideration of risks, benefits and other options for treatment, the patient has consented to  Procedure(s): RIGHT BREAST LUMPECTOMY WITH RADIOACTIVE SEED LOCALIZATION (Right) as a surgical intervention.  The patient's history has been reviewed, patient examined, no change in status, stable for surgery.  I have reviewed the patient's chart and labs.  Questions were answered to the patient's satisfaction.     [place]

## 2019-03-14 NOTE — Anesthesia Procedure Notes (Signed)
Procedure Name: LMA Insertion Date/Time: 03/14/2019 7:32 AM Performed by: Marrianne Mood, CRNA Pre-anesthesia Checklist: Patient identified, Emergency Drugs available, Suction available, Patient being monitored and Timeout performed Patient Re-evaluated:Patient Re-evaluated prior to induction Oxygen Delivery Method: Circle system utilized Preoxygenation: Pre-oxygenation with 100% oxygen Induction Type: IV induction Ventilation: Mask ventilation without difficulty LMA: LMA inserted LMA Size: 4.0 Number of attempts: 1 Airway Equipment and Method: Bite block Placement Confirmation: positive ETCO2 Tube secured with: Tape Dental Injury: Teeth and Oropharynx as per pre-operative assessment

## 2019-03-15 ENCOUNTER — Encounter: Payer: Self-pay | Admitting: *Deleted

## 2019-03-18 NOTE — Addendum Note (Signed)
Addendum  created 03/18/19 1009 by Tawni Millers, CRNA   Charge Capture section accepted

## 2019-03-19 LAB — SURGICAL PATHOLOGY

## 2019-03-20 ENCOUNTER — Encounter: Payer: Self-pay | Admitting: Family Medicine

## 2019-05-23 DIAGNOSIS — Z6824 Body mass index (BMI) 24.0-24.9, adult: Secondary | ICD-10-CM | POA: Diagnosis not present

## 2019-05-23 DIAGNOSIS — Z113 Encounter for screening for infections with a predominantly sexual mode of transmission: Secondary | ICD-10-CM | POA: Diagnosis not present

## 2019-05-23 DIAGNOSIS — Z01419 Encounter for gynecological examination (general) (routine) without abnormal findings: Secondary | ICD-10-CM | POA: Diagnosis not present

## 2019-05-27 ENCOUNTER — Telehealth: Payer: Self-pay | Admitting: Oncology

## 2019-05-27 NOTE — Telephone Encounter (Signed)
I received an email from Dr. Jana Hakim to schedule Katelyn Hodges to be seen in the high risk breast clinc for atypical hyperplasia. I attempted to call Katelyn Hodges to schedule an appt w/Dr. Jana Hakim, but her voicemail was full.

## 2019-09-03 ENCOUNTER — Encounter: Payer: Self-pay | Admitting: Gastroenterology

## 2019-09-07 MED FILL — ESCITALOPRAM 10 MG TABLET: 10 | 90 days supply | Qty: 90 | Fill #0

## 2019-10-14 ENCOUNTER — Ambulatory Visit: Payer: 59 | Admitting: Family Medicine

## 2019-10-28 ENCOUNTER — Encounter: Payer: Self-pay | Admitting: Gastroenterology

## 2019-11-05 ENCOUNTER — Encounter (HOSPITAL_COMMUNITY): Payer: Self-pay

## 2019-11-11 ENCOUNTER — Other Ambulatory Visit: Payer: Self-pay | Admitting: Obstetrics & Gynecology

## 2019-11-11 ENCOUNTER — Encounter: Payer: 59 | Admitting: Gastroenterology

## 2019-11-12 DIAGNOSIS — Z13820 Encounter for screening for osteoporosis: Secondary | ICD-10-CM | POA: Diagnosis not present

## 2019-11-12 DIAGNOSIS — M8580 Other specified disorders of bone density and structure, unspecified site: Secondary | ICD-10-CM | POA: Diagnosis not present

## 2019-11-26 ENCOUNTER — Other Ambulatory Visit: Payer: Self-pay | Admitting: Surgery

## 2019-11-26 DIAGNOSIS — Z1231 Encounter for screening mammogram for malignant neoplasm of breast: Secondary | ICD-10-CM

## 2019-12-06 ENCOUNTER — Other Ambulatory Visit (HOSPITAL_COMMUNITY): Payer: Self-pay | Admitting: Obstetrics & Gynecology

## 2019-12-09 MED FILL — ESCITALOPRAM 5 MG TABLET: 5 | 90 days supply | Qty: 90 | Fill #0

## 2020-01-02 ENCOUNTER — Encounter: Payer: 59 | Admitting: Gastroenterology

## 2020-01-03 ENCOUNTER — Ambulatory Visit
Admission: RE | Admit: 2020-01-03 | Discharge: 2020-01-03 | Disposition: A | Discharge status: Home / Self Care | Payer: 59 | Source: Ambulatory Visit | Attending: Surgery | Admitting: Surgery

## 2020-01-03 ENCOUNTER — Other Ambulatory Visit: Payer: Self-pay

## 2020-01-03 DIAGNOSIS — Z1231 Encounter for screening mammogram for malignant neoplasm of breast: Secondary | ICD-10-CM

## 2020-06-05 ENCOUNTER — Ambulatory Visit (INDEPENDENT_AMBULATORY_CARE_PROVIDER_SITE_OTHER): Payer: No Typology Code available for payment source | Admitting: Internal Medicine

## 2020-06-05 ENCOUNTER — Other Ambulatory Visit (HOSPITAL_COMMUNITY): Payer: Self-pay

## 2020-06-05 ENCOUNTER — Encounter: Payer: Self-pay | Admitting: Internal Medicine

## 2020-06-05 ENCOUNTER — Other Ambulatory Visit: Payer: Self-pay

## 2020-06-05 VITALS — BP 102/68 | HR 80 | Temp 98.6°F | Ht 70.5 in | Wt 155.7 lb

## 2020-06-05 DIAGNOSIS — Z9189 Other specified personal risk factors, not elsewhere classified: Secondary | ICD-10-CM

## 2020-06-05 DIAGNOSIS — Z1211 Encounter for screening for malignant neoplasm of colon: Secondary | ICD-10-CM

## 2020-06-05 DIAGNOSIS — F17210 Nicotine dependence, cigarettes, uncomplicated: Secondary | ICD-10-CM

## 2020-06-05 DIAGNOSIS — R000 Tachycardia, unspecified: Secondary | ICD-10-CM

## 2020-06-05 DIAGNOSIS — Z124 Encounter for screening for malignant neoplasm of cervix: Secondary | ICD-10-CM

## 2020-06-05 MED ORDER — BUPROPION HCL ER (XL) 150 MG PO TB24
150.0000 mg | ORAL_TABLET | Freq: Every day | ORAL | 1 refills | Status: DC
  Filled 2020-06-05: qty 90, 90d supply, fill #0

## 2020-06-05 NOTE — Patient Instructions (Signed)
-  Nice seeing you today!!  -Start Wellbutrin 150 mg daily.  -Schedule CBT sessions.  -referrals for colonoscopy, cardiology and high risk breast clinic today.  -Schedule follow up with me in 8-12 weeks for your physical. Please come in fasting that day.

## 2020-06-05 NOTE — Progress Notes (Signed)
New Patient Office Visit     This visit occurred during the SARS-CoV-2 public health emergency.  Safety protocols were in place, including screening questions prior to the visit, additional usage of staff PPE, and extensive cleaning of exam room while observing appropriate contact time as indicated for disinfecting solutions.    CC/Reason for Visit: Establish care, discuss chronic conditions and acute concerns Previous PCP: Dr. Raliegh Scarlet Last Visit: 2019  HPI: Katelyn Hodges is a 55 y.o. female who is coming in today for the above mentioned reasons. 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  She is overdue for screening colonoscopy, Pap smears and mammograms are up-to-date.  She is a CMA at [employer], she has been a smoker of a pack a day for around 35 years.  She drinks about 6-7 beers 2 to 3 days a week.  She has no known drug allergies.  Her family history significant for mother with breast cancer diagnosed at age 72, rheumatoid arthritis and SLE.  She is very interested in smoking cessation today.  She has these episodes of tachycardia with palpitations and elevated blood pressure.  She was referred to see Dr. Debara Pickett in 2019.  I only see an initial visit with him.  She tells me that she thinks she was diagnosed with adrenal pheochromocytoma, however this does not reflect in his initial notes.  I do see a calcium CT score of 0.  There is a mention that she wore a monitor but I do not see results.   Past Medical/Surgical History: Past Medical History:  Diagnosis Date  . Anxiety   . Breast mass, right   . Genital warts   . GERD (gastroesophageal reflux disease)   . Glaucoma   . Headache(784.0)   . Smoker   . Ulcer     Past Surgical History:  Procedure Laterality Date  . BREAST LUMPECTOMY  1998  . BREAST LUMPECTOMY WITH RADIOACTIVE SEED LOCALIZATION Right 03/14/2019    Procedure: RIGHT BREAST LUMPECTOMY WITH RADIOACTIVE SEED LOCALIZATION;  Surgeon: Erroll Luna, MD;  Location: [facility];  Service: General;  Laterality: Right;  . DIAGNOSTIC LAPAROSCOPY    . TONSILLECTOMY      Social History:  reports that she has been smoking cigarettes. She has a 30.00 pack-year smoking history. She has never used smokeless tobacco. She reports current alcohol use of about 6.0 standard drinks of alcohol per week. She reports that she does not use drugs.  Allergies: Allergies  Allergen Reactions  . Bee Venom Anaphylaxis    Family History:  Family History  Problem Relation Age of Onset  . Cancer Mother        breast  . Heart disease Father   . Hyperlipidemia Father   . Nephritis Sister   . Heart disease Maternal Uncle   . Cancer Maternal Uncle   . Hyperlipidemia Maternal Uncle   . Hypertension Maternal Uncle   . Heart disease Paternal Uncle   . Diabetes Maternal Grandmother   . Heart disease Maternal Grandfather   . Cancer Maternal Grandfather   . Hyperlipidemia Maternal Grandfather   . Hypertension Maternal Grandfather   . Heart disease Paternal Grandfather   . Cancer Paternal Grandfather      Current Outpatient Medications:  .  buPROPion (WELLBUTRIN XL) 150 MG 24 hr tablet, Take 1 tablet (150 mg total) by mouth daily., Disp: 90 tablet, Rfl: 1 .  ibuprofen (ADVIL) 800 MG tablet, Take 1 tablet (800 mg total) by mouth every 8 (eight) hours as needed., Disp: 30 tablet, Rfl: 0  Review of Systems:  Constitutional: Denies fever, chills, diaphoresis, appetite change and fatigue.  HEENT: Denies photophobia, eye pain, redness, hearing loss, ear pain, congestion, sore throat, rhinorrhea, sneezing, mouth sores, trouble swallowing, neck pain, neck stiffness and tinnitus.   Respiratory: Denies SOB, DOE, cough, chest tightness,  and wheezing.   Cardiovascular: Denies chest pain, palpitations and leg swelling.  Gastrointestinal: Denies nausea,  vomiting, abdominal pain, diarrhea, constipation, blood in stool and abdominal distention.  Genitourinary: Denies dysuria, urgency, frequency, hematuria, flank pain and difficulty urinating.  Endocrine: Denies: hot or cold intolerance, sweats, changes in hair or nails, polyuria, polydipsia. Musculoskeletal: Denies myalgias, back pain, joint swelling, arthralgias and gait problem.  Skin: Denies pallor, rash and wound.  Neurological: Denies dizziness, seizures, syncope, weakness, light-headedness, numbness and headaches.  Hematological: Denies adenopathy. Easy bruising, personal or family bleeding history  Psychiatric/Behavioral: Denies suicidal ideation, mood changes, confusion, nervousness, sleep disturbance and agitation    Physical Exam: Vitals:   06/05/20 1300  BP: 102/68  Pulse: 80  Temp: 98.6 F (37 C)  TempSrc: Oral  SpO2: 91%  Weight: 155 lb 11.2 oz (70.6 kg)  Height: 5' 10.5" (1.791 m)   Body mass index is 22.02 kg/m.  Constitutional: NAD, calm, comfortable Eyes: PERRL, lids and conjunctivae normal ENMT: Mucous membranes are moist.  Respiratory: clear to auscultation bilaterally, no wheezing, no crackles. Normal respiratory effort. No accessory muscle use.  Cardiovascular: Regular rate and rhythm, no murmurs / rubs / gallops. No extremity edema.  Neurologic: Grossly intact and nonfocal Psychiatric: Normal judgment and insight. Alert and oriented x 3. Normal mood.    Impression and Plan:  Screening for malignant neoplasm of colon  - Plan: Ambulatory referral to Gastroenterology  Screening for cervical cancer  - Plan: Ambulatory referral to Obstetrics / Gynecology  Tachycardia -She has paroxysmal episodes of hypertension, bradycardia, diaphoresis.  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 -I do not see within Dr. Lysbeth Penner notes this diagnosis.  In fact I only see an initial visit. -I will refer her back to Dr. Debara Pickett  for evaluation.  Cigarette nicotine dependence without complication  -I have discussed tobacco cessation with the patient.  I have counseled the patient regarding the negative impacts of continued tobacco use including but not limited to lung cancer, COPD, and cardiovascular disease.  I have discussed alternatives to tobacco and modalities that may help facilitate tobacco cessation including but not limited to biofeedback, hypnosis, and medications.  Total time spent with tobacco counseling was 4 minutes. -She is very interested in smoking cessation today.  She will arrange CBT sessions, I will start Wellbutrin 150 mg daily and see her back in 8 weeks.   At high risk for breast cancer  - Plan: Ambulatory referral to [facility]    Patient Instructions  -Nice seeing you today!!  -Start Wellbutrin 150 mg daily.  -Schedule CBT sessions.  -referrals for colonoscopy, cardiology and high risk breast clinic today.  -Schedule follow up with me in 8-12 weeks for your physical. Please come in fasting that day.     Lelon Frohlich, MD  Primary Care at [facility]

## 2020-06-08 ENCOUNTER — Encounter: Payer: Self-pay | Admitting: Internal Medicine

## 2020-06-08 DIAGNOSIS — L988 Other specified disorders of the skin and subcutaneous tissue: Secondary | ICD-10-CM

## 2020-06-09 ENCOUNTER — Telehealth: Payer: Self-pay | Admitting: Hematology and Oncology

## 2020-06-09 NOTE — Telephone Encounter (Signed)
Received a new pt referral for the high risk breast clinic. Katelyn Hodges cld and has been scheduled to see Dr. Lindi Adie on 7/1 at 8:15am. Due to her work schedule, pt needed a Friday appt. Aware to arrive 15 minutes early.

## 2020-06-29 ENCOUNTER — Telehealth: Payer: Self-pay | Admitting: Dermatology

## 2020-06-29 NOTE — Telephone Encounter (Signed)
Patient is calling for a referral appointment.  Patient is scheduled for 01/04/2021 at 3:30 with Lavonna Monarch, MD.

## 2020-06-29 NOTE — Telephone Encounter (Signed)
Referral attached to appointment

## 2020-07-01 ENCOUNTER — Encounter: Payer: Self-pay | Admitting: Internal Medicine

## 2020-07-17 ENCOUNTER — Encounter: Payer: No Typology Code available for payment source | Admitting: Hematology and Oncology

## 2020-08-06 NOTE — Progress Notes (Signed)
[facility] NOTE  Patient Care Team: Isaac Bliss, Rayford Halsted, MD as PCP - General (Internal Medicine) Maisie Fus, MD as Consulting Physician (Obstetrics and Gynecology)  CHIEF COMPLAINTS/PURPOSE OF CONSULTATION:  Newly diagnosed right breast atypical ductal hyperplasia  HISTORY OF PRESENTING ILLNESS:  Katelyn Hodges 55 y.o. female is here because of recent diagnosis of high risk of breast cancer. She palpated a lump in her upper-outer right breast. Diagnostic mammogram and [place] on 12/21/18 showed 5 x 8 x 8 mm hypoechoic irregular mass in the right breast at 9:30. Right lumpectomy on 03/14/19 showed focal atypical ductal hyperplasia involving a complex sclerosing lesion and calcifications. Mammogram on 01/03/20 showed no evidence of malignancy. She presents to the clinic today for initial evaluation and discussion of treatment options.   I reviewed her records extensively and collaborated the history with the patient.  MEDICAL HISTORY:  Past Medical History:  Diagnosis Date   Anxiety    Breast mass, right    Genital warts    GERD (gastroesophageal reflux disease)    Glaucoma    Headache(784.0)    Smoker    Ulcer     SURGICAL HISTORY: Past Surgical History:  Procedure Laterality Date   BREAST LUMPECTOMY  1998   BREAST LUMPECTOMY WITH RADIOACTIVE SEED LOCALIZATION Right 03/14/2019   Procedure: RIGHT BREAST LUMPECTOMY WITH RADIOACTIVE SEED LOCALIZATION;  Surgeon: Erroll Luna, MD;  Location: [facility];  Service: General;  Laterality: Right;   DIAGNOSTIC LAPAROSCOPY     TONSILLECTOMY      SOCIAL HISTORY: Social History   Socioeconomic History   Marital status: Single    Spouse name: Not on file   Number of children: Not on file   Years of education: Not on file   Highest education level: Not on file  Occupational History   Not on file  Tobacco Use   Smoking status: Every Day    Packs/day: 1.00    Years: 30.00    Pack  years: 30.00    Types: Cigarettes   Smokeless tobacco: Never  Vaping Use   Vaping Use: Never used  Substance and Sexual Activity   Alcohol use: Yes    Alcohol/week: 6.0 standard drinks    Types: 6 Standard drinks or equivalent per week    Comment: daily beer   Drug use: No   Sexual activity: Not Currently    Birth control/protection: Post-menopausal  Other Topics Concern   Not on file  Social History Narrative   Not on file   Social Determinants of Health   Financial Resource Strain: Not on file  Food Insecurity: Not on file  Transportation Needs: Not on file  Physical Activity: Not on file  Stress: Not on file  Social Connections: Not on file  Intimate Partner Violence: Not on file    FAMILY HISTORY: Family History  Problem Relation Age of Onset   Cancer Mother        breast   Heart disease Father    Hyperlipidemia Father    Nephritis Sister    Heart disease Maternal Uncle    Cancer Maternal Uncle    Hyperlipidemia Maternal Uncle    Hypertension Maternal Uncle    Heart disease Paternal Uncle    Diabetes Maternal Grandmother    Heart disease Maternal Grandfather    Cancer Maternal Grandfather    Hyperlipidemia Maternal Grandfather    Hypertension Maternal Grandfather    Heart disease Paternal Grandfather    Cancer Paternal  Grandfather     ALLERGIES:  is allergic to bee venom.  MEDICATIONS:  Current Outpatient Medications  Medication Sig Dispense Refill   buPROPion (WELLBUTRIN XL) 150 MG 24 hr tablet Take 1 tablet (150 mg total) by mouth daily. 90 tablet 1   ibuprofen (ADVIL) 800 MG tablet Take 1 tablet (800 mg total) by mouth every 8 (eight) hours as needed. 30 tablet 0   No current facility-administered medications for this visit.    REVIEW OF SYSTEMS:   Constitutional: Denies fevers, chills or abnormal night sweats Eyes: Denies blurriness of vision, double vision or watery eyes Ears, nose, mouth, throat, and face: Denies mucositis or sore  throat Respiratory: Denies cough, dyspnea or wheezes Cardiovascular: Denies palpitation, chest discomfort or lower extremity swelling Gastrointestinal:  Denies nausea, heartburn or change in bowel habits Skin: Denies abnormal skin rashes Lymphatics: Denies new lymphadenopathy or easy bruising Neurological:Denies numbness, tingling or new weaknesses Behavioral/Psych: Mood is stable, no new changes  Breast:  Denies any palpable lumps or discharge All other systems were reviewed with the patient and are negative.  PHYSICAL EXAMINATION: ECOG PERFORMANCE STATUS: 0 - Asymptomatic  Vitals:   08/07/20 1021  BP: (!) 148/77  Pulse: 76  Resp: 18  Temp: 98.1 F (36.7 C)  SpO2: (!) 10%   Filed Weights   08/07/20 1021  Weight: 155 lb 7 oz (70.5 kg)     LABORATORY DATA:  I have reviewed the data as listed Lab Results  Component Value Date   WBC 9.4 03/11/2019   HGB 13.6 03/11/2019   HCT 41.2 03/11/2019   MCV 88.6 03/11/2019   PLT 295 03/11/2019   Lab Results  Component Value Date   NA 137 03/11/2019   K 4.2 03/11/2019   CL 103 03/11/2019   CO2 23 03/11/2019    RADIOGRAPHIC STUDIES: I have personally reviewed the radiological reports and agreed with the findings in the report.  ASSESSMENT AND PLAN:  Atypical ductal hyperplasia of right breast Pathology review: I discussed the difference between atypical ductal hyperplasia, DCIS and invasive breast cancer. I explained to her that atypical ductal hyperplasia  is characterized by a proliferation of uniform epithelial cells filling part, but not the entirety, of the involved duct. ADH is associated with an increased risk of both ipsilateral and contralateral breast cancer and thus provides evidence of underlying breast abnormalities that predispose to breast cancer.   Prognosis: Lifetime risk of breast cancer: 31% (mother had breast cancer and she has ADH)   I discussed the risks and benefits of tamoxifen therapy.  Because the  patient already has lots of menopausal symptoms she does not want to take any tamoxifen at this time.   1. Exercise 30 minutes daily 2.  Increasing fruits and vegetables and less red meat 3.  We discussed the role of vitamin D and turmeric as well as reducing alcohol use.    Surveillance plan: Annual mammograms and breast exams and annual breast MRIs I will set her up with a breast MRI in the next month.  Return to clinic once a year for follow-up. All questions were answered. The patient knows to call the clinic with any problems, questions or concerns.   Rulon Eisenmenger, MD, MPH 08/07/2020    I, Thana Ates, am acting as scribe for Nicholas Lose, MD.  I have reviewed the above documentation for accuracy and completeness, and I agree with the above.

## 2020-08-07 ENCOUNTER — Inpatient Hospital Stay
Payer: No Typology Code available for payment source | Attending: Hematology and Oncology | Admitting: Hematology and Oncology

## 2020-08-07 ENCOUNTER — Ambulatory Visit: Payer: No Typology Code available for payment source | Admitting: Internal Medicine

## 2020-08-07 ENCOUNTER — Other Ambulatory Visit: Payer: Self-pay

## 2020-08-07 VITALS — BP 148/77 | HR 76 | Temp 98.1°F | Resp 18 | Ht 70.5 in | Wt 155.4 lb

## 2020-08-07 DIAGNOSIS — Z9189 Other specified personal risk factors, not elsewhere classified: Secondary | ICD-10-CM

## 2020-08-07 DIAGNOSIS — N6091 Unspecified benign mammary dysplasia of right breast: Secondary | ICD-10-CM | POA: Insufficient documentation

## 2020-08-07 DIAGNOSIS — Z87891 Personal history of nicotine dependence: Secondary | ICD-10-CM | POA: Insufficient documentation

## 2020-08-07 NOTE — Assessment & Plan Note (Signed)
Pathology review: I discussed the difference between atypical ductal hyperplasia, DCIS and invasive breast cancer. I explained to her that atypical ductal hyperplasia is characterized by a proliferation of uniform epithelial cells filling part, but not the entirety, of the involved duct. ADH is associated with an increased risk of both ipsilateral and contralateral breast cancer and thus provides evidence of underlying breast abnormalities that predispose to breast cancer.   Prognosis:    I discussed the risks and benefits of tamoxifen therapy. I believe that the risks far outweigh any benefits from tamoxifen.   1. Exercise 30 minutes daily 2. Weight loss 3. Increasing fruits and vegetables and less red meat    Surveillance plan: Annual mammograms and breast exams

## 2020-08-14 ENCOUNTER — Encounter: Payer: Self-pay | Admitting: Obstetrics and Gynecology

## 2020-08-14 ENCOUNTER — Ambulatory Visit (INDEPENDENT_AMBULATORY_CARE_PROVIDER_SITE_OTHER): Payer: No Typology Code available for payment source | Admitting: Obstetrics and Gynecology

## 2020-08-14 VITALS — BP 100/62 | HR 69 | Ht 67.7 in | Wt 154.8 lb

## 2020-08-14 DIAGNOSIS — N6091 Unspecified benign mammary dysplasia of right breast: Secondary | ICD-10-CM

## 2020-08-14 DIAGNOSIS — Z8739 Personal history of other diseases of the musculoskeletal system and connective tissue: Secondary | ICD-10-CM

## 2020-08-14 DIAGNOSIS — Z113 Encounter for screening for infections with a predominantly sexual mode of transmission: Secondary | ICD-10-CM

## 2020-08-14 DIAGNOSIS — N951 Menopausal and female climacteric states: Secondary | ICD-10-CM

## 2020-08-14 DIAGNOSIS — Z803 Family history of malignant neoplasm of breast: Secondary | ICD-10-CM

## 2020-08-14 DIAGNOSIS — Z01419 Encounter for gynecological examination (general) (routine) without abnormal findings: Secondary | ICD-10-CM | POA: Diagnosis not present

## 2020-08-14 NOTE — Patient Instructions (Signed)

## 2020-08-14 NOTE — Progress Notes (Signed)
55 y.o. No obstetric history on file. Single White or Caucasian Not Hispanic or Latino female here for annual exam and to establish care. PMP female. 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 She has a 31% lifetime risk of breast cancer and is followed by Dr Lindi Adie in Oncology. She declined Tamoxifen therapy secondary to vasomotor symptoms.  She is PMP, no bleeding. She is having vasomotor symptoms. Daytime is tolerable, night time is bad. She wakes up mildly wet from sweat. She is up 2-3 x a night.   She states she snores, hasn't had a sleep study.   Patient is interested in STD testing. She has been sexually active in the last year.   CT calcium score was 0   LMP 2 years ago.       Sexually active: No.  The current method of family planning is post menopausal status.    Exercising: No    Paient plans on starting.  Smoker:  yes 1 pack a day   Health Maintenance: Pap:  06/11/19 normal, no hpv testing.  History of abnormal Pap:  yes, cryosurgery of her cervix as a teen.  MMG: 01/08/20 density B Bi-rads 1 neg   BMD:   11/12/19 osteopenia.  Colonoscopy: none, she is scheduling it.   TDaP:  2019 Gardasil: NA   reports that she has been smoking cigarettes. She has a 30.00 pack-year smoking history. She has never used smokeless tobacco. She reports current alcohol use of about 6.0 standard drinks of alcohol per week. She reports that she does not use drugs. 3-4 beers a night 3-4 days a week. 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   Past Medical History:  Diagnosis Date   Anxiety    Breast mass, right    Genital warts    GERD (gastroesophageal reflux disease)    Glaucoma    Headache(784.0)    Smoker    Ulcer   Patient states she doesn't have glaucoma.   Past Surgical History:  Procedure Laterality Date   BREAST LUMPECTOMY  1998   BREAST LUMPECTOMY WITH RADIOACTIVE SEED LOCALIZATION Right 03/14/2019   Procedure: RIGHT BREAST LUMPECTOMY  WITH RADIOACTIVE SEED LOCALIZATION;  Surgeon: Erroll Luna, MD;  Location: [facility];  Service: General;  Laterality: Right;   DIAGNOSTIC LAPAROSCOPY     TONSILLECTOMY      Current Outpatient Medications  Medication Sig Dispense Refill   escitalopram (LEXAPRO) 10 MG tablet      No current facility-administered medications for this visit.    Family History  Problem Relation Age of Onset   Breast cancer Mother 12   Cancer Mother        breast   Heart disease Father    Hyperlipidemia Father    Nephritis Sister    Heart disease Maternal Uncle    Cancer Maternal Uncle    Hyperlipidemia Maternal Uncle    Hypertension Maternal Uncle    Heart disease Paternal Uncle    Diabetes Maternal Grandmother    Heart disease Maternal Grandfather    Cancer Maternal Grandfather    Hyperlipidemia Maternal Grandfather    Hypertension Maternal Grandfather    Heart disease Paternal Grandfather    Cancer Paternal Grandfather     Review of Systems  All other systems reviewed and are negative.  Exam:   BP 100/62   Pulse 69   Ht 5' 7.7" (1.72 m)   Wt 154 lb 12.8 oz (70.2 kg)  SpO2 99%   BMI 23.75 kg/m   Weight change: '@WEIGHTCHANGE'$ @ Height:   Height: 5' 7.7" (172 cm)  Ht Readings from Last 3 Encounters:  08/14/20 5' 7.7" (1.72 m)  08/07/20 5' 10.5" (1.791 m)  06/05/20 5' 10.5" (1.791 m)    General appearance: alert, cooperative and appears stated age Head: Normocephalic, without obvious abnormality, atraumatic Neck: no adenopathy, supple, symmetrical, trachea midline and thyroid normal to inspection and palpation Lungs: clear to auscultation bilaterally Cardiovascular: regular rate and rhythm Breasts: normal appearance, no masses or tenderness Abdomen: soft, non-tender; non distended,  no masses,  no organomegaly Extremities: extremities normal, atraumatic, no cyanosis or edema Skin: Skin color, texture, turgor normal. No rashes or lesions Lymph nodes: Cervical,  supraclavicular, and axillary nodes normal. No abnormal inguinal nodes palpated Neurologic: Grossly normal   Pelvic: External genitalia:  no lesions              Urethra:  normal appearing urethra with no masses, tenderness or lesions              Bartholins and Skenes: normal                 Vagina: normal appearing vagina with normal color and discharge, no lesions              Cervix: no lesions               Bimanual Exam:  Uterus:  normal size, contour, position, consistency, mobility, non-tender              Adnexa: no mass, fullness, tenderness               Rectovaginal: Confirms               Anus:  normal sphincter tone, no lesions  Gae Dry chaperoned for the exam.  1. Well woman exam Discussed breast self exam Discussed calcium and vit D intake She will schedule her colonoscopy Labs with primary She is working on smoking cessation and decreasing her ETOH intake  2. Screening examination for STD (sexually transmitted disease) - HIV Antibody (routine testing w rflx) - RPR - Hepatitis C antibody - SURESWAB CT/NG/T. vaginalis  3. History of osteopenia On calcium and vit d F/U DEXA next year  4. Vasomotor symptoms due to menopause Overall tolerable Not a good candidate for HRT Discussed avoiding triggers Discussed possible use of gabapentin   5. Atypical ductal hyperplasia of right breast Mammogram UTD MRI tomorrow Followed at [facility]  6. Family history of breast cancer in mother- age 50

## 2020-08-17 LAB — SURESWAB CT/NG/T. VAGINALIS
C. trachomatis RNA, TMA: NOT DETECTED
N. gonorrhoeae RNA, TMA: NOT DETECTED
Trichomonas vaginalis RNA: NOT DETECTED

## 2020-08-17 LAB — RPR: RPR Ser Ql: NONREACTIVE

## 2020-08-17 LAB — HEPATITIS C ANTIBODY
Hepatitis C Ab: NONREACTIVE
SIGNAL TO CUT-OFF: 0.08 (ref <1.00)

## 2020-08-17 LAB — HIV ANTIBODY (ROUTINE TESTING W REFLEX): HIV 1&2 Ab, 4th Generation: NONREACTIVE

## 2020-08-21 ENCOUNTER — Other Ambulatory Visit: Payer: No Typology Code available for payment source

## 2020-09-01 ENCOUNTER — Encounter: Payer: Self-pay | Admitting: Internal Medicine

## 2020-09-01 ENCOUNTER — Telehealth: Payer: Self-pay

## 2020-09-01 NOTE — Telephone Encounter (Signed)
Patient returned call pt stated best time to reach her is between 2-3.

## 2020-09-02 NOTE — Telephone Encounter (Signed)
Patient answered via MyChart.

## 2020-09-04 ENCOUNTER — Encounter: Payer: No Typology Code available for payment source | Admitting: Internal Medicine

## 2020-10-08 ENCOUNTER — Encounter: Payer: Self-pay | Admitting: Gastroenterology

## 2020-10-08 ENCOUNTER — Other Ambulatory Visit: Payer: Self-pay | Admitting: Internal Medicine

## 2020-10-08 ENCOUNTER — Other Ambulatory Visit (HOSPITAL_COMMUNITY): Payer: Self-pay

## 2020-10-08 ENCOUNTER — Other Ambulatory Visit: Payer: Self-pay

## 2020-10-08 ENCOUNTER — Ambulatory Visit (INDEPENDENT_AMBULATORY_CARE_PROVIDER_SITE_OTHER): Payer: No Typology Code available for payment source | Admitting: Internal Medicine

## 2020-10-08 VITALS — BP 120/78 | HR 79 | Temp 97.7°F | Ht 68.5 in | Wt 159.0 lb

## 2020-10-08 DIAGNOSIS — R4586 Emotional lability: Secondary | ICD-10-CM

## 2020-10-08 DIAGNOSIS — N6091 Unspecified benign mammary dysplasia of right breast: Secondary | ICD-10-CM

## 2020-10-08 DIAGNOSIS — M79646 Pain in unspecified finger(s): Secondary | ICD-10-CM

## 2020-10-08 DIAGNOSIS — Z1231 Encounter for screening mammogram for malignant neoplasm of breast: Secondary | ICD-10-CM

## 2020-10-08 DIAGNOSIS — E559 Vitamin D deficiency, unspecified: Secondary | ICD-10-CM

## 2020-10-08 DIAGNOSIS — Z0000 Encounter for general adult medical examination without abnormal findings: Secondary | ICD-10-CM | POA: Diagnosis not present

## 2020-10-08 DIAGNOSIS — Z23 Encounter for immunization: Secondary | ICD-10-CM | POA: Diagnosis not present

## 2020-10-08 DIAGNOSIS — F17200 Nicotine dependence, unspecified, uncomplicated: Secondary | ICD-10-CM

## 2020-10-08 LAB — CBC WITH DIFFERENTIAL/PLATELET
Basophils Absolute: 0 10*3/uL (ref 0.0–0.1)
Basophils Relative: 0.3 % (ref 0.0–3.0)
Eosinophils Absolute: 0.3 10*3/uL (ref 0.0–0.7)
Eosinophils Relative: 3.6 % (ref 0.0–5.0)
HCT: 43.6 % (ref 36.0–46.0)
Hemoglobin: 14.7 g/dL (ref 12.0–15.0)
Lymphocytes Relative: 24.8 % (ref 12.0–46.0)
Lymphs Abs: 1.8 10*3/uL (ref 0.7–4.0)
MCHC: 33.6 g/dL (ref 30.0–36.0)
MCV: 88.7 fl (ref 78.0–100.0)
Monocytes Absolute: 0.5 10*3/uL (ref 0.1–1.0)
Monocytes Relative: 7 % (ref 3.0–12.0)
Neutro Abs: 4.6 10*3/uL (ref 1.4–7.7)
Neutrophils Relative %: 64.3 % (ref 43.0–77.0)
Platelets: 309 10*3/uL (ref 150.0–400.0)
RBC: 4.92 Mil/uL (ref 3.87–5.11)
RDW: 13.5 % (ref 11.5–15.5)
WBC: 7.1 10*3/uL (ref 4.0–10.5)

## 2020-10-08 LAB — COMPREHENSIVE METABOLIC PANEL
ALT: 15 U/L (ref 0–35)
AST: 15 U/L (ref 0–37)
Albumin: 4.4 g/dL (ref 3.5–5.2)
Alkaline Phosphatase: 65 U/L (ref 39–117)
BUN: 14 mg/dL (ref 6–23)
CO2: 27 mEq/L (ref 19–32)
Calcium: 9.9 mg/dL (ref 8.4–10.5)
Chloride: 104 mEq/L (ref 96–112)
Creatinine, Ser: 0.73 mg/dL (ref 0.40–1.20)
GFR: 92.83 mL/min (ref >60.00)
Glucose, Bld: 96 mg/dL (ref 70–99)
Potassium: 4.6 mEq/L (ref 3.5–5.1)
Sodium: 140 mEq/L (ref 135–145)
Total Bilirubin: 0.4 mg/dL (ref 0.2–1.2)
Total Protein: 6.9 g/dL (ref 6.0–8.3)

## 2020-10-08 LAB — LIPID PANEL
Cholesterol: 225 mg/dL — ABNORMAL HIGH (ref 0–200)
HDL: 56.8 mg/dL (ref >39.00)
LDL Cholesterol: 144 mg/dL — ABNORMAL HIGH (ref 0–99)
NonHDL: 168.43
Total CHOL/HDL Ratio: 4
Triglycerides: 123 mg/dL (ref 0.0–149.0)
VLDL: 24.6 mg/dL (ref 0.0–40.0)

## 2020-10-08 LAB — VITAMIN D 25 HYDROXY (VIT D DEFICIENCY, FRACTURES): VITD: 27.7 ng/mL — ABNORMAL LOW (ref 30.00–100.00)

## 2020-10-08 LAB — HEMOGLOBIN A1C: Hgb A1c MFr Bld: 5.4 % (ref 4.6–6.5)

## 2020-10-08 LAB — VITAMIN B12: Vitamin B-12: 268 pg/mL (ref 211–911)

## 2020-10-08 LAB — TSH: TSH: 0.88 u[IU]/mL (ref 0.35–5.50)

## 2020-10-08 MED ORDER — ESCITALOPRAM OXALATE 10 MG PO TABS
10.0000 mg | ORAL_TABLET | Freq: Every day | ORAL | 1 refills | Status: DC
  Filled 2020-10-08: qty 90, 90d supply, fill #0
  Filled 2021-03-30: qty 90, 90d supply, fill #1

## 2020-10-08 NOTE — Progress Notes (Signed)
Established Patient Office Visit     This visit occurred during the SARS-CoV-2 public health emergency.  Safety protocols were in place, including screening questions prior to the visit, additional usage of staff PPE, and extensive cleaning of exam room while observing appropriate contact time as indicated for disinfecting solutions.    CC/Reason for Visit: Annual preventive exam  HPI: Katelyn Hodges is a 55 y.o. female who is coming in today for the above mentioned reasons. 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  She is overdue for screening colonoscopy, Pap smears and mammograms are up-to-date.  She has decreased her smoking usage to less than half a pack a day.  She has a follow-up with dermatology, she has not yet scheduled her colonoscopy.  She is overdue for flu, shingles and her COVID booster.  She has been describing pain of both thumbs right above her thenar eminence.  She has routine eye and dental care.  She walks and does absent squats on a daily basis.   Past Medical/Surgical History: Past Medical History:  Diagnosis Date   Anxiety    Breast mass, right    Genital warts    GERD (gastroesophageal reflux disease)    Glaucoma    Headache(784.0)    Smoker    Ulcer     Past Surgical History:  Procedure Laterality Date   BREAST LUMPECTOMY  1998   BREAST LUMPECTOMY WITH RADIOACTIVE SEED LOCALIZATION Right 03/14/2019   Procedure: RIGHT BREAST LUMPECTOMY WITH RADIOACTIVE SEED LOCALIZATION;  Surgeon: Erroll Luna, MD;  Location: [facility];  Service: General;  Laterality: Right;   DIAGNOSTIC LAPAROSCOPY     TONSILLECTOMY      Social History:  reports that she has been smoking cigarettes. She has a 30.00 pack-year smoking history. She has never used smokeless tobacco. She reports current alcohol use of about 6.0 standard drinks per week. She reports that she  does not use drugs.  Allergies: Allergies  Allergen Reactions   Bee Venom Anaphylaxis    Family History:  Family History  Problem Relation Age of Onset   Breast cancer Mother 30   Cancer Mother        breast   Heart disease Father    Hyperlipidemia Father    Nephritis Sister    Heart disease Maternal Uncle    Cancer Maternal Uncle    Hyperlipidemia Maternal Uncle    Hypertension Maternal Uncle    Heart disease Paternal Uncle    Diabetes Maternal Grandmother    Heart disease Maternal Grandfather    Cancer Maternal Grandfather    Hyperlipidemia Maternal Grandfather    Hypertension Maternal Grandfather    Heart disease Paternal Grandfather    Cancer Paternal Grandfather      Current Outpatient Medications:    escitalopram (LEXAPRO) 10 MG tablet, Take 1 tablet (10 mg total) by mouth daily., Disp: 90 tablet, Rfl: 1  Review of Systems:  Constitutional: Denies fever, chills, diaphoresis, appetite change and fatigue.  HEENT: Denies photophobia, eye pain, redness, hearing loss, ear pain, congestion, sore throat, rhinorrhea, sneezing, mouth sores, trouble swallowing, neck pain, neck stiffness and tinnitus.   Respiratory: Denies SOB, DOE, cough, chest tightness,  and wheezing.   Cardiovascular: Denies chest pain, palpitations and leg swelling.  Gastrointestinal: Denies nausea, vomiting, abdominal pain, diarrhea, constipation, blood in stool and abdominal distention.  Genitourinary: Denies dysuria, urgency, frequency, hematuria, flank pain and difficulty urinating.  Endocrine: Denies: hot or cold intolerance, sweats, changes in hair or nails, polyuria, polydipsia. Musculoskeletal: Denies myalgias, back pain, joint swelling, arthralgias and gait problem.  Skin: Denies pallor, rash and wound.  Neurological: Denies dizziness, seizures, syncope, weakness, light-headedness, numbness and headaches.  Hematological: Denies adenopathy. Easy bruising, personal or family bleeding history   Psychiatric/Behavioral: Denies suicidal ideation, mood changes, confusion, nervousness, sleep disturbance and agitation    Physical Exam: Vitals:   10/08/20 0732  BP: 120/78  Pulse: 79  Temp: 97.7 F (36.5 C)  TempSrc: Oral  SpO2: 98%  Weight: 159 lb (72.1 kg)  Height: 5' 8.5" (1.74 m)    Body mass index is 23.82 kg/m.   Constitutional: NAD, calm, comfortable Eyes: PERRL, lids and conjunctivae normal ENMT: Mucous membranes are moist. Posterior pharynx clear of any exudate or lesions. Normal dentition. Tympanic membrane is pearly white, no erythema or bulging. Neck: normal, supple, no masses, no thyromegaly Respiratory: clear to auscultation bilaterally, no wheezing, no crackles. Normal respiratory effort. No accessory muscle use.  Cardiovascular: Regular rate and rhythm, no murmurs / rubs / gallops. No extremity edema. 2+ pedal pulses. No carotid bruits.  Abdomen: no tenderness, no masses palpated. No hepatosplenomegaly. Bowel sounds positive.  Musculoskeletal: no clubbing / cyanosis. No joint deformity upper and lower extremities. Good ROM, no contractures. Normal muscle tone.  Skin: no rashes, lesions, ulcers. No induration Neurologic: CN 2-12 grossly intact. Sensation intact, DTR normal. Strength 5/5 in all 4.  Psychiatric: Normal judgment and insight. Alert and oriented x 3. Normal mood.    Impression and Plan:  Encounter for preventive health examination  -She has routine eye and dental care. -First shingles vaccine today, she will get her flu vaccine at work and her [place] booster at pharmacy. -Screening labs today. -Healthy lifestyle discussed in detail. -Breast and cervical cancer screening is up-to-date. -She is overdue for initial screening colonoscopy, GI referral has been placed.  Need for shingles vaccine -First shingles vaccine administered today.  Atypical ductal hyperplasia of right breast  -Followed by Dr. Lindi Adie at the [facility].  Vitamin D deficiency -Check vitamin D level today.  Thumb pain, unspecified laterality  - Plan: Ambulatory referral to Sports Medicine -Diagnosis unclear, may be some tendinitis, not classic carpal tunnel syndrome.  Tobacco use disorder -She has decreased her smoking usage, never took Wellbutrin that was prescribed.  Mood swings  - Plan: escitalopram (LEXAPRO) 10 MG tablet -phq9 score today is 2.    Patient Instructions  -Nice seeing you today!!  -Lab work today; will notify you once results are available.  -First shingles vaccine today.  -Remember flu and COVID boosters.  -Remember to schedule your colonoscopy.  -Schedule follow up in 6 months.      Lelon Frohlich, MD [facility]

## 2020-10-08 NOTE — Patient Instructions (Signed)
-  Nice seeing you today!!  -Lab work today; will notify you once results are available.  -First shingles vaccine today.  -Remember flu and COVID boosters.  -Remember to schedule your colonoscopy.  -Schedule follow up in 6 months.

## 2020-10-08 NOTE — Addendum Note (Signed)
Addended by: Elmer Picker on: 10/08/2020 08:11 AM   Modules accepted: Orders

## 2020-10-08 NOTE — Addendum Note (Signed)
Addended by: Westley Hummer B on: 10/08/2020 04:42 PM   Modules accepted: Orders

## 2020-10-09 ENCOUNTER — Other Ambulatory Visit (HOSPITAL_COMMUNITY): Payer: Self-pay

## 2020-10-09 ENCOUNTER — Encounter: Payer: Self-pay | Admitting: Internal Medicine

## 2020-10-09 ENCOUNTER — Other Ambulatory Visit: Payer: Self-pay | Admitting: Internal Medicine

## 2020-10-09 DIAGNOSIS — E559 Vitamin D deficiency, unspecified: Secondary | ICD-10-CM

## 2020-10-09 DIAGNOSIS — E785 Hyperlipidemia, unspecified: Secondary | ICD-10-CM | POA: Insufficient documentation

## 2020-10-09 MED ORDER — VITAMIN D (ERGOCALCIFEROL) 1.25 MG (50000 UNIT) PO CAPS
50000.0000 [IU] | ORAL_CAPSULE | ORAL | 0 refills | Status: AC
  Filled 2020-10-09 – 2020-10-17 (×2): qty 12, 84d supply, fill #0

## 2020-10-12 ENCOUNTER — Other Ambulatory Visit (HOSPITAL_COMMUNITY): Payer: Self-pay

## 2020-10-13 ENCOUNTER — Other Ambulatory Visit: Payer: Self-pay | Admitting: Internal Medicine

## 2020-10-13 DIAGNOSIS — E559 Vitamin D deficiency, unspecified: Secondary | ICD-10-CM

## 2020-10-13 DIAGNOSIS — E785 Hyperlipidemia, unspecified: Secondary | ICD-10-CM

## 2020-10-17 ENCOUNTER — Other Ambulatory Visit (HOSPITAL_COMMUNITY): Payer: Self-pay

## 2020-10-20 ENCOUNTER — Other Ambulatory Visit (HOSPITAL_COMMUNITY): Payer: Self-pay

## 2020-12-04 ENCOUNTER — Telehealth: Payer: Self-pay

## 2020-12-04 ENCOUNTER — Ambulatory Visit: Payer: No Typology Code available for payment source

## 2020-12-04 NOTE — Telephone Encounter (Signed)
Multiple attempts made to reach patient for pre visit appt; message left on VM for patient to call back to reschedule appt for PV; if patient fails to call back to the office to reschedule PV -PV and procedure appts will be cancelled;

## 2020-12-04 NOTE — Telephone Encounter (Signed)
Patient returned call to the office and spoke with scheduler-patient advised she would call back at a later time and reschedule her PV and her procedure appts; procedure appt cancelled by scheduler;

## 2020-12-07 ENCOUNTER — Encounter: Payer: Self-pay | Admitting: Internal Medicine

## 2020-12-08 ENCOUNTER — Encounter: Payer: Self-pay | Admitting: Internal Medicine

## 2020-12-18 ENCOUNTER — Encounter: Payer: No Typology Code available for payment source | Admitting: Gastroenterology

## 2021-01-04 ENCOUNTER — Ambulatory Visit: Payer: No Typology Code available for payment source | Admitting: Dermatology

## 2021-01-21 ENCOUNTER — Encounter: Payer: Self-pay | Admitting: Gastroenterology

## 2021-01-22 ENCOUNTER — Ambulatory Visit
Admission: RE | Admit: 2021-01-22 | Discharge: 2021-01-22 | Disposition: A | Discharge status: Home / Self Care | Payer: No Typology Code available for payment source | Source: Ambulatory Visit | Attending: Internal Medicine | Admitting: Internal Medicine

## 2021-01-22 DIAGNOSIS — Z1231 Encounter for screening mammogram for malignant neoplasm of breast: Secondary | ICD-10-CM

## 2021-01-26 ENCOUNTER — Ambulatory Visit: Payer: No Typology Code available for payment source | Admitting: Physician Assistant

## 2021-02-01 ENCOUNTER — Encounter: Payer: Self-pay | Admitting: Internal Medicine

## 2021-02-12 ENCOUNTER — Ambulatory Visit (AMBULATORY_SURGERY_CENTER): Payer: No Typology Code available for payment source | Admitting: *Deleted

## 2021-02-12 ENCOUNTER — Other Ambulatory Visit (HOSPITAL_COMMUNITY): Payer: Self-pay

## 2021-02-12 ENCOUNTER — Other Ambulatory Visit: Payer: Self-pay

## 2021-02-12 VITALS — Ht 68.5 in | Wt 150.0 lb

## 2021-02-12 DIAGNOSIS — Z1211 Encounter for screening for malignant neoplasm of colon: Secondary | ICD-10-CM

## 2021-02-12 MED ORDER — NA SULFATE-K SULFATE-MG SULF 17.5-3.13-1.6 GM/177ML PO SOLN
1.0000 | ORAL | 0 refills | Status: DC
  Filled 2021-02-12: qty 354, 1d supply, fill #0

## 2021-02-12 NOTE — Progress Notes (Signed)

## 2021-02-15 ENCOUNTER — Other Ambulatory Visit (HOSPITAL_COMMUNITY): Payer: Self-pay

## 2021-02-19 IMAGING — US ULTRASOUND RIGHT BREAST LIMITED
1 series · 13 of 13 positions shown · non-contrast
Comparison: Previous exam(s).

CLINICAL DATA: Patient presents for palpable abnormality within the
upper-outer right breast.

EXAM:
DIGITAL DIAGNOSTIC RIGHT MAMMOGRAM WITH CAD AND TOMO
ULTRASOUND RIGHT BREAST

[Series 1: ultrasound right breast limited · 0.06mm/px · 13 of 13 slices shown]
[im 1/13]
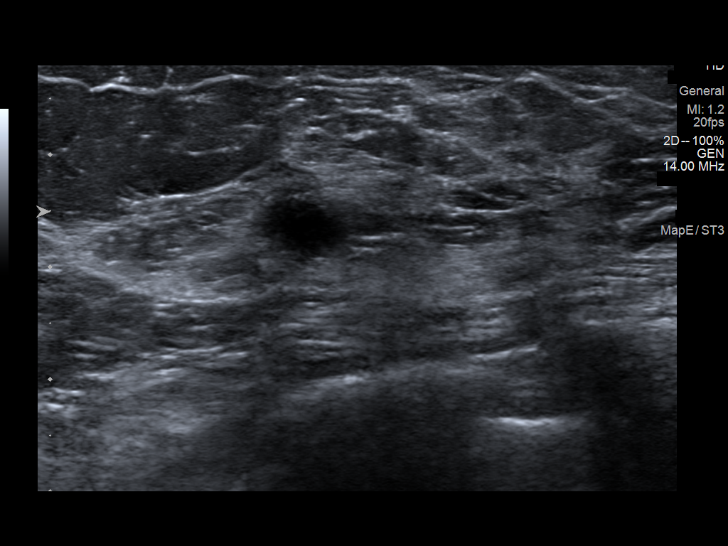
[im 2/13]
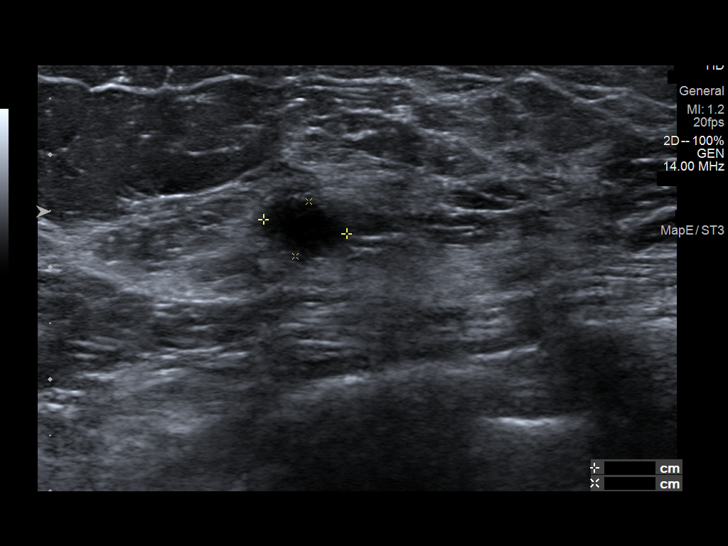
[im 3/13]
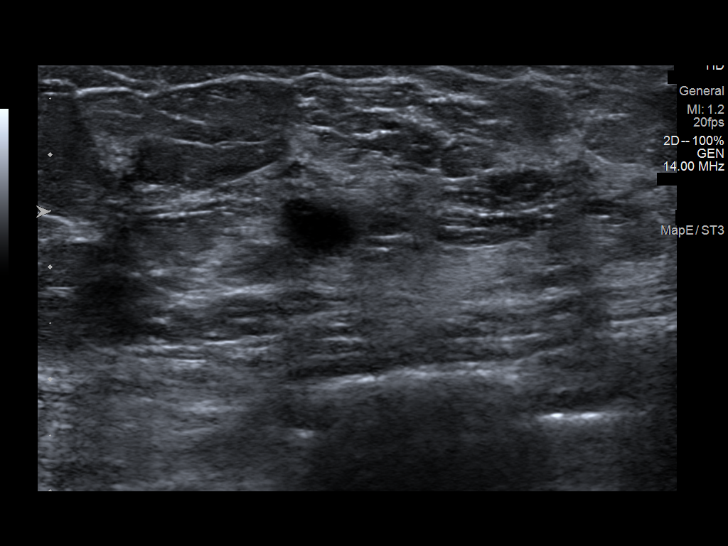
[im 4/13]
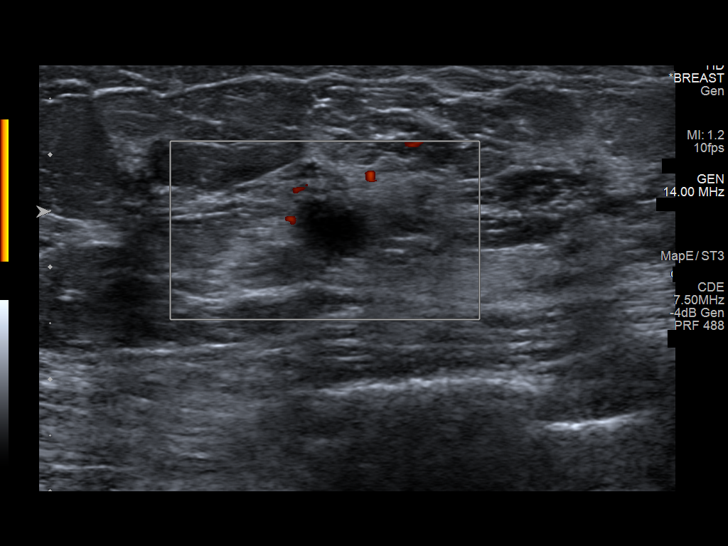
[im 5/13]
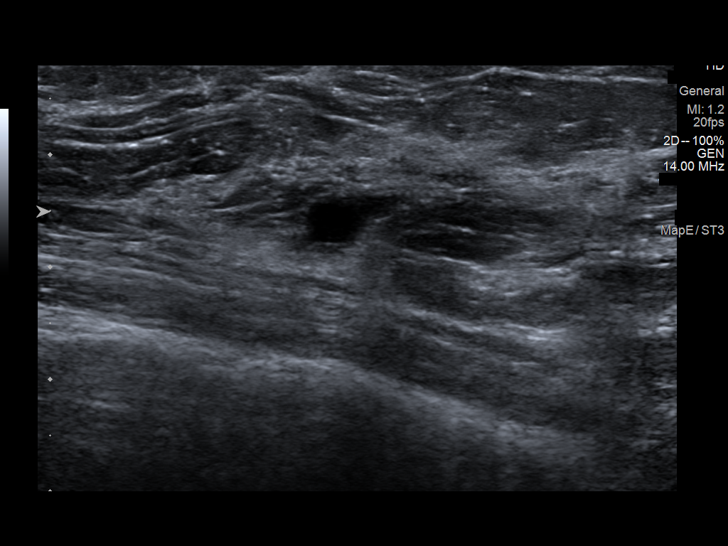
[im 6/13]
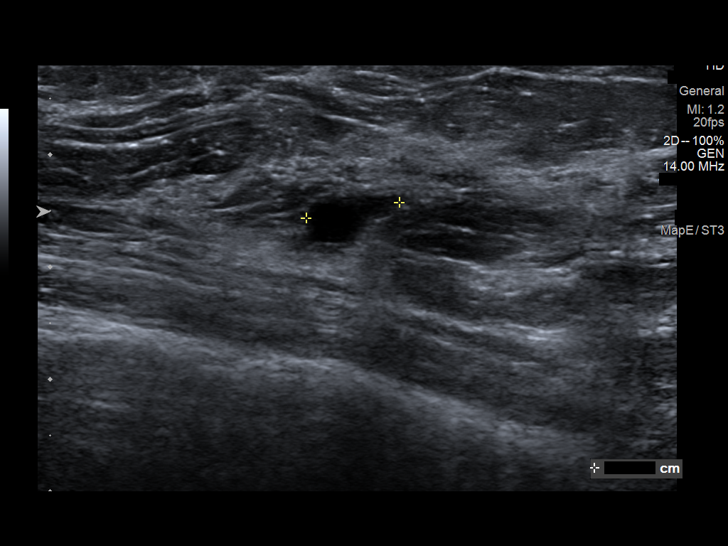
[im 7/13]
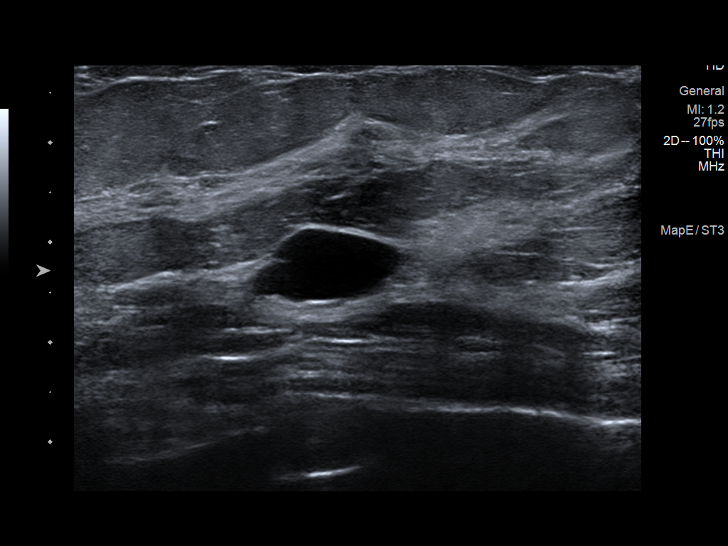
[im 8/13]
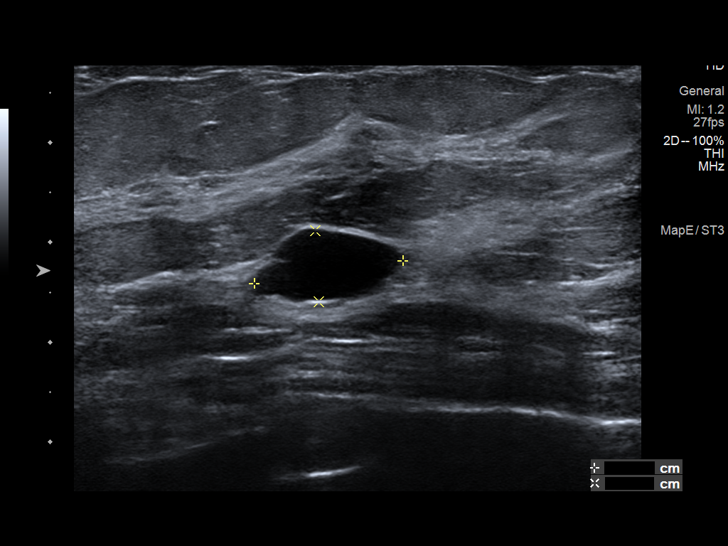
[im 9/13]
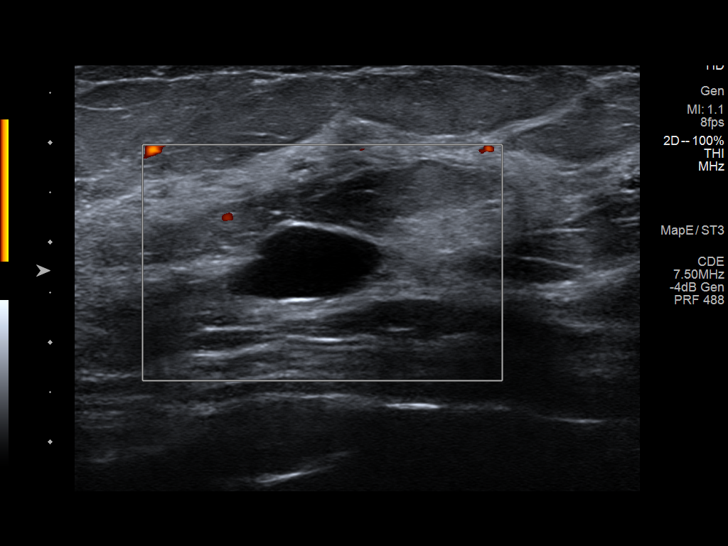
[im 10/13]
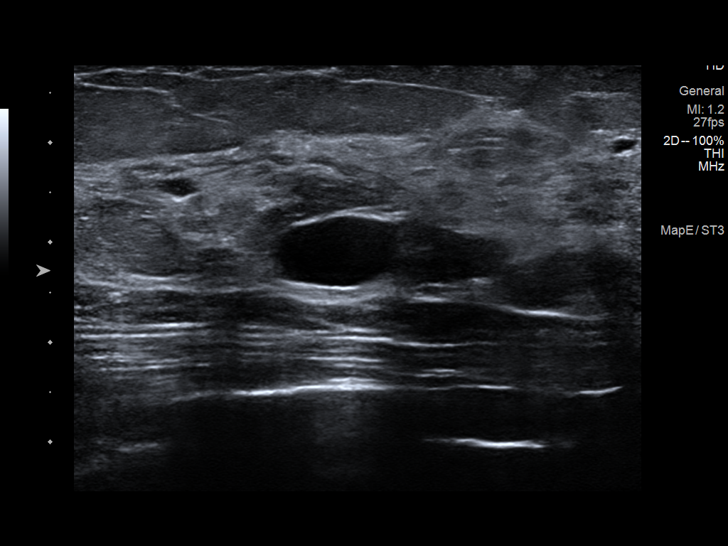
[im 11/13]
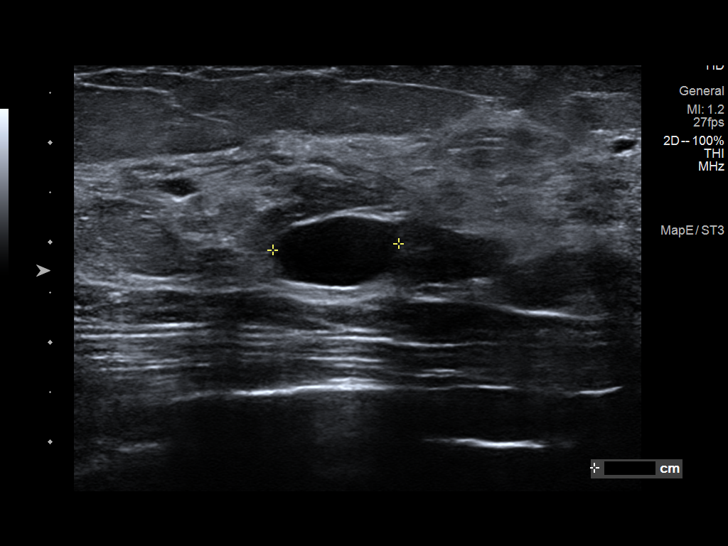
[im 12/13]
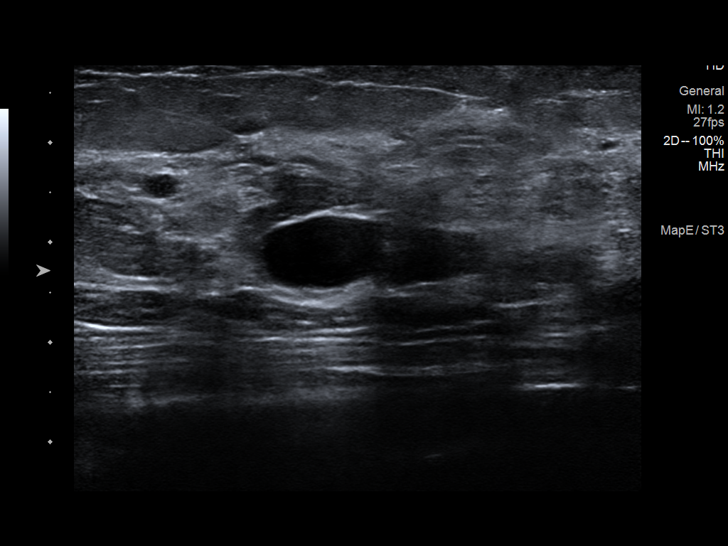
[im 13/13]
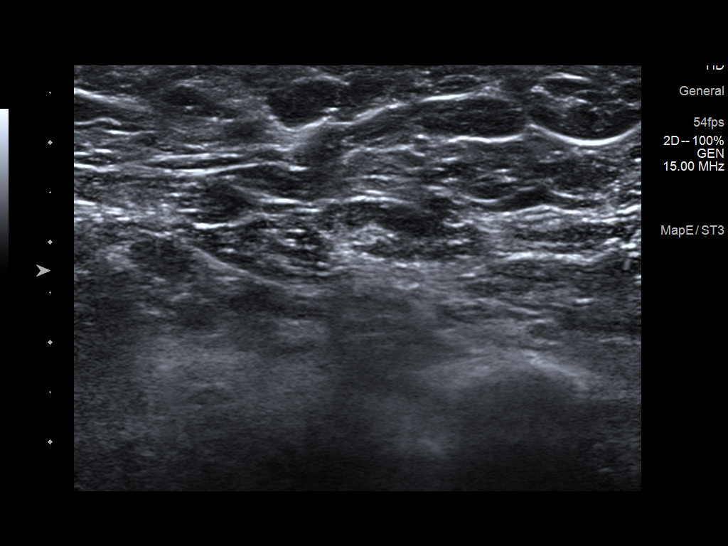

[13 of 13 positions shown; findings below may reference images not displayed]

ACR Breast Density Category c: The breast tissue is heterogeneously
dense, which may obscure small masses.
FINDINGS: Multiple oval circumscribed masses are demonstrated throughout the
right breast compatible with cysts. There is a focal area of
distortion within the upper-outer right breast underlying the
palpable marker.

Mammographic images were processed with CAD.

On physical exam, I palpate a small mass within the upper-outer
right breast.

Targeted ultrasound is performed, showing a 5 x 8 x 8 mm irregular
hypoechoic mass right breast 9:30 o'clock 5 cm from the nipple at
the site of palpable concern. No right axillary adenopathy.
IMPRESSION: Indeterminate right breast mass 9:30 o'clock.

RECOMMENDATION:
Ultrasound-guided core needle biopsy right breast mass 9:30 o'clock.

I have discussed the findings and recommendations with the patient.
Results were also provided in writing at the conclusion of the
visit. If applicable, a reminder letter will be sent to the patient
regarding the next appointment.

BI-RADS CATEGORY  4: Suspicious.

## 2021-02-25 ENCOUNTER — Encounter: Payer: Self-pay | Admitting: Gastroenterology

## 2021-02-26 ENCOUNTER — Ambulatory Visit (AMBULATORY_SURGERY_CENTER): Payer: No Typology Code available for payment source | Admitting: Gastroenterology

## 2021-02-26 ENCOUNTER — Other Ambulatory Visit: Payer: Self-pay

## 2021-02-26 ENCOUNTER — Encounter: Payer: Self-pay | Admitting: Gastroenterology

## 2021-02-26 VITALS — BP 113/67 | HR 65 | Temp 98.0°F | Resp 13 | Ht 68.5 in | Wt 150.0 lb

## 2021-02-26 DIAGNOSIS — D120 Benign neoplasm of cecum: Secondary | ICD-10-CM

## 2021-02-26 DIAGNOSIS — Z1211 Encounter for screening for malignant neoplasm of colon: Secondary | ICD-10-CM

## 2021-02-26 MED ORDER — SODIUM CHLORIDE 0.9 % IV SOLN: 500.0000 mL | Freq: Once | INTRAVENOUS | Status: DC

## 2021-02-26 NOTE — Progress Notes (Signed)
Pt's states no medical or surgical changes since previsit or office visit.  ° °VS DT °

## 2021-02-26 NOTE — Patient Instructions (Addendum)
Handouts on polyps & diverticulosis & high fiber diet given to you today  Await pathology results on polyps removed    YOU HAD AN ENDOSCOPIC PROCEDURE TODAY AT [facility]:   Refer to the procedure report that was given to you for any specific questions about what was found during the examination.  If the procedure report does not answer your questions, please call your gastroenterologist to clarify.  If you requested that your care partner not be given the details of your procedure findings, then the procedure report has been included in a sealed envelope for you to review at your convenience later.  YOU SHOULD EXPECT: Some feelings of bloating in the abdomen. Passage of more gas than usual.  Walking can help get rid of the air that was put into your GI tract during the procedure and reduce the bloating. If you had a lower endoscopy (such as a colonoscopy or flexible sigmoidoscopy) you may notice spotting of blood in your stool or on the toilet paper. If you underwent a bowel prep for your procedure, you may not have a normal bowel movement for a few days.  Please Note:  You might notice some irritation and congestion in your nose or some drainage.  This is from the oxygen used during your procedure.  There is no need for concern and it should clear up in a day or so.  SYMPTOMS TO REPORT IMMEDIATELY:  Following lower endoscopy (colonoscopy or flexible sigmoidoscopy):  Excessive amounts of blood in the stool  Significant tenderness or worsening of abdominal pains  Swelling of the abdomen that is new, acute  Fever of 100F or higher   For urgent or emergent issues, a gastroenterologist can be reached at any hour by calling [phone]. Do not use MyChart messaging for urgent concerns.    DIET:  We do recommend a small meal at first, but then you may proceed to your regular diet.  Drink plenty of fluids but you should avoid alcoholic beverages for 24 hours.  ACTIVITY:   You should plan to take it easy for the rest of today and you should NOT DRIVE or use heavy machinery until tomorrow (because of the sedation medicines used during the test).    FOLLOW UP: Our staff will call the number listed on your records 48-72 hours following your procedure to check on you and address any questions or concerns that you may have regarding the information given to you following your procedure. If we do not reach you, we will leave a message.  We will attempt to reach you two times.  During this call, we will ask if you have developed any symptoms of COVID 19. If you develop any symptoms (ie: fever, flu-like symptoms, shortness of breath, cough etc.) before then, please call [phone].  If you test positive for Covid 19 in the 2 weeks post procedure, please call and report this information to [place].    If any biopsies were taken you will be contacted by phone or by letter within the next 1-3 weeks.  Please call us at [phone] if you have not heard about the biopsies in 3 weeks.    SIGNATURES/CONFIDENTIALITY: You and/or your care partner have signed paperwork which will be entered into your electronic medical record.  These signatures attest to the fact that that the information above on your After Visit Summary has been reviewed and is understood.  Full responsibility of the confidentiality of this discharge information lies  with you and/or your care-partner.

## 2021-02-26 NOTE — Progress Notes (Signed)
To Pacu, VSS. Report to [email or web address] 

## 2021-02-26 NOTE — Progress Notes (Signed)
Referring Provider: Isaac Bliss, [facility]* Primary Care Physician:  Isaac Bliss, Rayford Halsted, MD  Reason for Procedure:  Colon cancer screening   IMPRESSION:  Need for colon cancer screening Appropriate candidate for monitored anesthesia care  PLAN: Colonoscopy in the [facility] today   HPI: Katelyn Hodges is a 56 y.o. female presents for screening colonoscopy.  No prior colonoscopy or colon cancer screening.  No baseline GI symptoms.   No known family history of colon cancer or polyps. No family history of uterine/endometrial cancer, pancreatic cancer or gastric/stomach cancer.   Past Medical History:  Diagnosis Date   Anxiety    Breast mass, right    Genital warts    GERD (gastroesophageal reflux disease)    Glaucoma    Headache(784.0)    Smoker    Ulcer     Past Surgical History:  Procedure Laterality Date   BREAST LUMPECTOMY  1998   BREAST LUMPECTOMY WITH RADIOACTIVE SEED LOCALIZATION Right 03/14/2019   Procedure: RIGHT BREAST LUMPECTOMY WITH RADIOACTIVE SEED LOCALIZATION;  Surgeon: Erroll Luna, MD;  Location: [facility];  Service: General;  Laterality: Right;   DIAGNOSTIC LAPAROSCOPY     TONSILLECTOMY      Current Outpatient Medications  Medication Sig Dispense Refill   CALCIUM PO Take by mouth.     escitalopram (LEXAPRO) 10 MG tablet Take 1 tablet (10 mg total) by mouth daily. (Patient not taking: Reported on 02/12/2021) 90 tablet 1   Current Facility-Administered Medications  Medication Dose Route Frequency Provider Last Rate Last Admin   0.9 %  sodium chloride infusion  500 mL Intravenous Once Thornton Park, MD        Allergies as of 02/26/2021 - Review Complete 02/26/2021  Allergen Reaction Noted   Bee venom Anaphylaxis 06/05/2020    Family History  Problem Relation Age of Onset   Breast cancer Mother 48   Cancer Mother        breast   Heart disease Father    Hyperlipidemia Father    Nephritis Sister    Heart disease  Maternal Uncle    Cancer Maternal Uncle    Hyperlipidemia Maternal Uncle    Hypertension Maternal Uncle    Heart disease Paternal Uncle    Diabetes Maternal Grandmother    Heart disease Maternal Grandfather    Cancer Maternal Grandfather    Hyperlipidemia Maternal Grandfather    Hypertension Maternal Grandfather    Throat cancer Maternal Grandfather    Lung cancer Maternal Grandfather    Heart disease Paternal Grandfather    Cancer Paternal Grandfather    Colon cancer Neg Hx    Colon polyps Neg Hx    Stomach cancer Neg Hx    Rectal cancer Neg Hx      Physical Exam: General:   Alert,  well-nourished, pleasant and cooperative in NAD Head:  Normocephalic and atraumatic. Eyes:  Sclera clear, no icterus.   Conjunctiva pink. Mouth:  No deformity or lesions.   Neck:  Supple; no masses or thyromegaly. Lungs:  Clear throughout to auscultation.   No wheezes. Heart:  Regular rate and rhythm; no murmurs. Abdomen:  Soft, non-tender, nondistended, normal bowel sounds, no rebound or guarding.  Msk:  Symmetrical. No boney deformities LAD: No inguinal or umbilical LAD Extremities:  No clubbing or edema. Neurologic:  Alert and  oriented x4;  grossly nonfocal Skin:  No obvious rash or bruise. Psych:  Alert and cooperative. Normal mood and affect.     Studies/Results: No results found.  Arch Methot L. Tarri Glenn, MD, MPH 02/26/2021, 1:19 PM

## 2021-02-26 NOTE — Progress Notes (Signed)
Called to room to assist during endoscopic procedure.  Patient ID and intended procedure confirmed with present staff. Received instructions for my participation in the procedure from the performing physician.  

## 2021-02-26 NOTE — Op Note (Signed)
[facility] Patient Name: Katelyn Hodges Procedure Date: 02/26/2021 1:28 PM MRN: [id number] Endoscopist: Thornton Park MD, MD Age: 56 Referring MD:  Date of Birth: 1965-04-17 Gender: Female Account #: [id number] Procedure:                Colonoscopy Indications:              Screening for colorectal malignant neoplasm, This                            is the patient's first colonoscopy                           No known family history of colon cancer or polyps Medicines:                Monitored Anesthesia Care Procedure:                Pre-Anesthesia Assessment:                           - Prior to the procedure, a History and Physical                            was performed, and patient medications and                            allergies were reviewed. The patient's tolerance of                            previous anesthesia was also reviewed. The risks                            and benefits of the procedure and the sedation                            options and risks were discussed with the patient.                            All questions were answered, and informed consent                            was obtained. Prior Anticoagulants: The patient has                            taken no previous anticoagulant or antiplatelet                            agents. ASA Grade Assessment: II - A patient with                            mild systemic disease. After reviewing the risks                            and benefits, the patient was deemed in  satisfactory condition to undergo the procedure.                           After obtaining informed consent, the colonoscope                            was passed under direct vision. Throughout the                            procedure, the patient's blood pressure, pulse, and                            oxygen saturations were monitored continuously. The                            Colonoscope was  introduced through the anus and                            advanced to the 3 cm into the ileum. A second                            forward view of the right colon was performed. The                            colonoscopy was performed without difficulty. The                            patient tolerated the procedure well. The quality                            of the bowel preparation was excellent. The                            terminal ileum, ileocecal valve, appendiceal                            orifice, and rectum were photographed. Scope In: 1:40:37 PM Scope Out: 1:56:50 PM Scope Withdrawal Time: 0 hours 12 minutes 58 seconds  Total Procedure Duration: 0 hours 16 minutes 13 seconds  Findings:                 The perianal and digital rectal examinations were                            normal.                           Multiple small and large-mouthed diverticula were                            found in the sigmoid colon and descending colon.                           Two flat polyps were found in the cecum. The polyps  were 1 mm in size. These polyps were removed with a                            cold snare. Resection and retrieval were complete.                            Estimated blood loss was minimal.                           The exam was otherwise without abnormality on                            direct and retroflexion views. Complications:            No immediate complications. Estimated blood loss:                            Minimal. Estimated Blood Loss:     Estimated blood loss was minimal. Impression:               - Diverticulosis in the sigmoid colon and in the                            descending colon.                           - Two 1 mm polyps in the cecum, removed with a cold                            snare. Resected and retrieved.                           - The examination was otherwise normal on direct                             and retroflexion views. Recommendation:           - Patient has a contact number available for                            emergencies. The signs and symptoms of potential                            delayed complications were discussed with the                            patient. Return to normal activities tomorrow.                            Written discharge instructions were provided to the                            patient.                           - High fiber diet.                           -  Continue present medications.                           - Await pathology results.                           - Repeat colonoscopy date to be determined after                            pending pathology results are reviewed for                            surveillance.                           - Emerging evidence supports eating a diet of                            fruits, vegetables, grains, calcium, and yogurt                            while reducing red meat and alcohol may reduce the                            risk of colon cancer.                           - Thank you for allowing me to be involved in your                            colon cancer prevention. Thornton Park MD, MD 02/26/2021 2:05:54 PM This report has been signed electronically.

## 2021-03-02 ENCOUNTER — Telehealth: Payer: Self-pay

## 2021-03-02 NOTE — Telephone Encounter (Signed)
°  Follow up Call-  Call back number 02/26/2021  Post procedure Call Back phone  # [phone]  Permission to leave phone message Yes  Some recent data might be hidden    Left message

## 2021-03-09 ENCOUNTER — Encounter: Payer: Self-pay | Admitting: Gastroenterology

## 2021-03-17 ENCOUNTER — Encounter: Payer: Self-pay | Admitting: Internal Medicine

## 2021-03-17 ENCOUNTER — Ambulatory Visit (INDEPENDENT_AMBULATORY_CARE_PROVIDER_SITE_OTHER): Payer: No Typology Code available for payment source | Admitting: Internal Medicine

## 2021-03-17 VITALS — BP 110/70 | HR 79 | Temp 97.7°F | Wt 153.4 lb

## 2021-03-17 DIAGNOSIS — Z23 Encounter for immunization: Secondary | ICD-10-CM

## 2021-03-17 DIAGNOSIS — E559 Vitamin D deficiency, unspecified: Secondary | ICD-10-CM

## 2021-03-17 DIAGNOSIS — E785 Hyperlipidemia, unspecified: Secondary | ICD-10-CM

## 2021-03-17 NOTE — Progress Notes (Signed)
? ? ? ?Established Patient Office Visit ? ? ? ? ?This visit occurred during the SARS-CoV-2 public health emergency.  Safety protocols were in place, including screening questions prior to the visit, additional usage of staff PPE, and extensive cleaning of exam room while observing appropriate contact time as indicated for disinfecting solutions.  ? ? ?CC/Reason for Visit: 6-month follow-up chronic medical conditions ? ?HPI: Katelyn Hodges is a 56 y.o. female who is coming in today for the above mentioned reasons. Past Medical History is significant for: Depression, hyperlipidemia, vitamin D deficiency, atypical ductal hyperplasia who just recently had an MRI and being followed by oncology, history of tobacco abuse who is smoking about a pack a day.  She is due to have vitamin D and lipids rechecked.  She is also requesting her second shingles vaccination.  She had a colonoscopy in February and is a 7-year callback. ? ? ?Past Medical/Surgical History: ?Past Medical History:  ?Diagnosis Date  ? Anxiety   ? Breast mass, right   ? Genital warts   ? GERD (gastroesophageal reflux disease)   ? Glaucoma   ? Headache(784.0)   ? Smoker   ? Ulcer   ? ? ?Past Surgical History:  ?Procedure Laterality Date  ? BREAST LUMPECTOMY  1998  ? BREAST LUMPECTOMY WITH RADIOACTIVE SEED LOCALIZATION Right 03/14/2019  ? Procedure: RIGHT BREAST LUMPECTOMY WITH RADIOACTIVE SEED LOCALIZATION;  Surgeon: Erroll Luna, MD;  Location: [facility];  Service: General;  Laterality: Right;  ? DIAGNOSTIC LAPAROSCOPY    ? TONSILLECTOMY    ? ? ?Social History: ? reports that she has been smoking cigarettes. She has a 30.00 pack-year smoking history. She has never used smokeless tobacco. She reports current alcohol use of about 12.0 standard drinks per week. She reports that she does not use drugs. ? ?Allergies: ?Allergies  ?Allergen Reactions  ? Bee Venom Anaphylaxis  ? ? ?Family History:  ?Family History  ?Problem Relation Age of Onset   ? Breast cancer Mother 63  ? Cancer Mother   ?     breast  ? Heart disease Father   ? Hyperlipidemia Father   ? Nephritis Sister   ? Heart disease Maternal Uncle   ? Cancer Maternal Uncle   ? Hyperlipidemia Maternal Uncle   ? Hypertension Maternal Uncle   ? Heart disease Paternal Uncle   ? Diabetes Maternal Grandmother   ? Heart disease Maternal Grandfather   ? Cancer Maternal Grandfather   ? Hyperlipidemia Maternal Grandfather   ? Hypertension Maternal Grandfather   ? Throat cancer Maternal Grandfather   ? Lung cancer Maternal Grandfather   ? Heart disease Paternal Grandfather   ? Cancer Paternal Grandfather   ? Colon cancer Neg Hx   ? Colon polyps Neg Hx   ? Stomach cancer Neg Hx   ? Rectal cancer Neg Hx   ? ? ? ?Current Outpatient Medications:  ?  CALCIUM PO, Take by mouth., Disp: , Rfl:  ?  escitalopram (LEXAPRO) 10 MG tablet, Take 1 tablet (10 mg total) by mouth daily., Disp: 90 tablet, Rfl: 1 ? ?Review of Systems:  ?Constitutional: Denies fever, chills, diaphoresis, appetite change and fatigue.  ?HEENT: Denies photophobia, eye pain, redness, hearing loss, ear pain, congestion, sore throat, rhinorrhea, sneezing, mouth sores, trouble swallowing, neck pain, neck stiffness and tinnitus.   ?Respiratory: Denies SOB, DOE, cough, chest tightness,  and wheezing.   ?Cardiovascular: Denies chest pain, palpitations and leg swelling.  ?Gastrointestinal: Denies nausea, vomiting, abdominal pain,  diarrhea, constipation, blood in stool and abdominal distention.  ?Genitourinary: Denies dysuria, urgency, frequency, hematuria, flank pain and difficulty urinating.  ?Endocrine: Denies: hot or cold intolerance, sweats, changes in hair or nails, polyuria, polydipsia. ?Musculoskeletal: Denies myalgias, back pain, joint swelling, arthralgias and gait problem.  ?Skin: Denies pallor, rash and wound.  ?Neurological: Denies dizziness, seizures, syncope, weakness, light-headedness, numbness and headaches.  ?Hematological: Denies  adenopathy. Easy bruising, personal or family bleeding history  ?Psychiatric/Behavioral: Denies suicidal ideation, mood changes, confusion, nervousness, sleep disturbance and agitation ? ? ? ?Physical Exam: ?Vitals:  ? 03/17/21 1521  ?BP: 110/70  ?Pulse: 79  ?Temp: 97.7 ?F (36.5 ?C)  ?TempSrc: Oral  ?SpO2: 98%  ?Weight: 153 lb 6.4 oz (69.6 kg)  ? ? ?Body mass index is 22.99 kg/m?. ? ? ?Constitutional: NAD, calm, comfortable ?Eyes: PERRL, lids and conjunctivae normal ?ENMT: Mucous membranes are moist.  ?Respiratory: clear to auscultation bilaterally, no wheezing, no crackles. Normal respiratory effort. No accessory muscle use.  ?Cardiovascular: Regular rate and rhythm, no murmurs / rubs / gallops. No extremity edema.  ?Neurologic: Grossly intact and nonfocal ?Psychiatric: Normal judgment and insight. Alert and oriented x 3. Normal mood.  ? ? ?Impression and Plan: ? ?Need for shingles vaccine ? - Plan: Varicella-zoster vaccine IM (Shingrix) ? ?Vitamin D deficiency ? - Plan: VITAMIN D 25 Hydroxy (Vit-D Deficiency, Fractures) ? ?Hyperlipidemia, unspecified hyperlipidemia type  ?- Plan: Lipid panel, Lipid panel ? ?Time spent: 31 minutes reviewing chart, interviewing and examining patient and formulating plan of care ? ? ? ? ?Lelon Frohlich, MD ?[facility] ? ? ?

## 2021-03-18 LAB — LIPID PANEL
Cholesterol: 234 mg/dL — ABNORMAL HIGH (ref 0–200)
HDL: 57.9 mg/dL (ref >39.00)
LDL Cholesterol: 142 mg/dL — ABNORMAL HIGH (ref 0–99)
NonHDL: 176.05
Total CHOL/HDL Ratio: 4
Triglycerides: 170 mg/dL — ABNORMAL HIGH (ref 0.0–149.0)
VLDL: 34 mg/dL (ref 0.0–40.0)

## 2021-03-18 LAB — VITAMIN D 25 HYDROXY (VIT D DEFICIENCY, FRACTURES): VITD: 43.36 ng/mL (ref 30.00–100.00)

## 2021-03-22 ENCOUNTER — Telehealth: Payer: Self-pay | Admitting: Internal Medicine

## 2021-03-22 NOTE — Telephone Encounter (Signed)
Patient states that she was returning someone call. She believes the call was in regards to her blood work.  ?Patient is requesting to be called back  ?

## 2021-03-30 ENCOUNTER — Other Ambulatory Visit (HOSPITAL_COMMUNITY): Payer: Self-pay

## 2021-04-06 ENCOUNTER — Encounter: Payer: Self-pay | Admitting: Internal Medicine

## 2021-04-08 ENCOUNTER — Ambulatory Visit: Payer: No Typology Code available for payment source | Admitting: Internal Medicine

## 2021-04-13 ENCOUNTER — Other Ambulatory Visit: Payer: Self-pay | Admitting: Internal Medicine

## 2021-04-13 DIAGNOSIS — Z122 Encounter for screening for malignant neoplasm of respiratory organs: Secondary | ICD-10-CM

## 2021-05-23 ENCOUNTER — Encounter (HOSPITAL_COMMUNITY): Payer: Self-pay

## 2021-05-23 ENCOUNTER — Ambulatory Visit (INDEPENDENT_AMBULATORY_CARE_PROVIDER_SITE_OTHER): Payer: No Typology Code available for payment source

## 2021-05-23 ENCOUNTER — Ambulatory Visit (HOSPITAL_COMMUNITY)
Admission: EM | Admit: 2021-05-23 | Discharge: 2021-05-23 | Disposition: A | Discharge status: Home / Self Care | Payer: No Typology Code available for payment source | Attending: Emergency Medicine | Admitting: Emergency Medicine

## 2021-05-23 DIAGNOSIS — Y92009 Unspecified place in unspecified non-institutional (private) residence as the place of occurrence of the external cause: Secondary | ICD-10-CM

## 2021-05-23 DIAGNOSIS — M25571 Pain in right ankle and joints of right foot: Secondary | ICD-10-CM

## 2021-05-23 DIAGNOSIS — S82831A Other fracture of upper and lower end of right fibula, initial encounter for closed fracture: Secondary | ICD-10-CM | POA: Diagnosis not present

## 2021-05-23 DIAGNOSIS — W19XXXA Unspecified fall, initial encounter: Secondary | ICD-10-CM | POA: Diagnosis not present

## 2021-05-23 DIAGNOSIS — S99921A Unspecified injury of right foot, initial encounter: Secondary | ICD-10-CM | POA: Diagnosis not present

## 2021-05-23 NOTE — Progress Notes (Signed)
Orthopedic Tech Progress Note ?Patient Details:  ?Katelyn Hodges ?01-13-1966 ?[id number] ? ?Ortho Devices ?Type of Ortho Device: Stirrup splint, Short leg splint ?Ortho Device/Splint Location: RLE ?Ortho Device/Splint Interventions: Ordered, Application, Adjustment ?  ?Post Interventions ?Patient Tolerated: Well ?Instructions Provided: Adjustment of device, Care of device ? ?Vernona Rieger ?05/23/2021, 6:50 PM ? ?

## 2021-05-23 NOTE — ED Triage Notes (Signed)
Pt reports of falling down her stairs today.  ?

## 2021-05-23 NOTE — Discharge Instructions (Addendum)
Please keep your splint in place until you are seen by orthopedics in 1 to 2 days.  I provided you with the contact information for [facility].  Please remember that the urgent care clinic does not require you to make an appointment, they see walk-in patients only. ? ?Please feel free to continue ibuprofen as needed for pain.  Please do not bear weight on your foot until you have been seen by orthopedics.  Please keep your foot elevated when seated. ? ?I appreciate your patience with getting a good diagnosis.  Thank you for visiting urgent care today.   ?

## 2021-05-23 NOTE — ED Provider Notes (Signed)
?[facility] ? ? ? ?CSN: [id number] ?Arrival date & time: 05/23/21  1410 ?  ? ?HISTORY  ? ?Chief Complaint  ?Patient presents with  ? Fall  ?  Right foot  ? ?HPI ?Katelyn Hodges is a 56 y.o. female. Patient states that she fell down her stairs today.  Patient reports living in a very old home and the bottom step is about 12 inches tall.  Patient states she was cleaning and miscalculated the distance because she was not paying close attention to the last step.  Patient states initially she noticed a small lump on the lateral side of her right foot that was not pulsating but states that over the last 5 hours swelling has grown significantly in size.  Patient states she has no ability to rotate her right foot outward.  Patient states rotating her foot inward is very painful.  Patient states she took some ibuprofen before she came to urgent care and has also been keeping an ice pack on her foot. Pt denies hx previous trauma to right foot.  ? ?The history is provided by the patient.  ?Past Medical History:  ?Diagnosis Date  ? Anxiety   ? Breast mass, right   ? Genital warts   ? GERD (gastroesophageal reflux disease)   ? Glaucoma   ? Headache(784.0)   ? Smoker   ? Ulcer   ? ?Patient Active Problem List  ? Diagnosis Date Noted  ? Hyperlipidemia 10/09/2020  ? Atypical ductal hyperplasia of right breast 08/07/2020  ? Vitamin D deficiency 12/26/2017  ? Family history of breast cancer in mother- age 78 12/21/2017  ? Family history of early CAD-  maternal uncles in 3s, paternal uncles in 82s 12/21/2017  ? Tachycardia 12/21/2017  ? Sweating abnormality 12/21/2017  ? Irregular heart rate 12/21/2017  ? Perimenopausal disorder 12/21/2017  ? Loud snoring 12/21/2017  ? Polyarthralgia 07/02/2014  ? Morning stiffness of joints 07/02/2014  ? Family history of rheumatic joint disease 07/02/2014  ? Polyuria 07/02/2014  ? chronic fatigue 07/02/2014  ? Mood swings 07/02/2014  ? Smoker 07/02/2014  ? GERD (gastroesophageal  reflux disease) 06/20/2010  ? Reactive hypoglycemia 06/20/2010  ? Paresthesias 06/20/2010  ? Abnormal thyroid stimulating hormone (TSH) level 06/20/2010  ? Tobacco use disorder 06/20/2010  ? ?Past Surgical History:  ?Procedure Laterality Date  ? BREAST LUMPECTOMY  1998  ? BREAST LUMPECTOMY WITH RADIOACTIVE SEED LOCALIZATION Right 03/14/2019  ? Procedure: RIGHT BREAST LUMPECTOMY WITH RADIOACTIVE SEED LOCALIZATION;  Surgeon: Erroll Luna, MD;  Location: [facility];  Service: General;  Laterality: Right;  ? DIAGNOSTIC LAPAROSCOPY    ? TONSILLECTOMY    ? ?OB History   ?No obstetric history on file. ?  ? ?Home Medications   ? ?Prior to Admission medications   ?Medication Sig Start Date End Date Taking? Authorizing Provider  ?CALCIUM PO Take by mouth.    [provider]  ?escitalopram (LEXAPRO) 10 MG tablet Take 1 tablet (10 mg total) by mouth daily. 10/08/20   Isaac Bliss, Rayford Halsted, MD  ? ? ?Family History ?Family History  ?Problem Relation Age of Onset  ? Breast cancer Mother 71  ? Cancer Mother   ?     breast  ? Heart disease Father   ? Hyperlipidemia Father   ? Nephritis Sister   ? Heart disease Maternal Uncle   ? Cancer Maternal Uncle   ? Hyperlipidemia Maternal Uncle   ? Hypertension Maternal Uncle   ? Heart disease  Paternal Uncle   ? Diabetes Maternal Grandmother   ? Heart disease Maternal Grandfather   ? Cancer Maternal Grandfather   ? Hyperlipidemia Maternal Grandfather   ? Hypertension Maternal Grandfather   ? Throat cancer Maternal Grandfather   ? Lung cancer Maternal Grandfather   ? Heart disease Paternal Grandfather   ? Cancer Paternal Grandfather   ? Colon cancer Neg Hx   ? Colon polyps Neg Hx   ? Stomach cancer Neg Hx   ? Rectal cancer Neg Hx   ? ?Social History ?Social History  ? ?Tobacco Use  ? Smoking status: Every Day  ?  Packs/day: 1.00  ?  Years: 30.00  ?  Pack years: 30.00  ?  Types: Cigarettes  ? Smokeless tobacco: Never  ?Vaping Use  ? Vaping Use: Never used   ?Substance Use Topics  ? Alcohol use: Yes  ?  Alcohol/week: 12.0 standard drinks  ?  Types: 12 Standard drinks or equivalent per week  ? Drug use: No  ? ?Allergies   ?Bee venom ? ?Review of Systems ?Review of Systems ?Pertinent findings noted in history of present illness.  ? ?Physical Exam ?Triage Vital Signs ?ED Triage Vitals  ?Enc Vitals Group  ?   BP 11/13/20 0827 (!) 147/82  ?   Pulse Rate 11/13/20 0827 72  ?   Resp 11/13/20 0827 18  ?   Temp 11/13/20 0827 98.3 ?F (36.8 ?C)  ?   Temp Source 11/13/20 0827 Oral  ?   SpO2 11/13/20 0827 98 %  ?   Weight --   ?   Height --   ?   Head Circumference --   ?   Peak Flow --   ?   Pain Score 11/13/20 0826 5  ?   Pain Loc --   ?   Pain Edu? --   ?   Excl. in [place]? --   ?No data found. ? ?Updated Vital Signs ?BP (!) 145/79 (BP Location: Right Arm)   Pulse 87   Temp 98.8 ?F (37.1 ?C) (Oral)   Resp 18   LMP 07/06/2018   SpO2 100%  ? ?Physical Exam ?Constitutional:   ?   General: She is awake. She is not in acute distress. ?   Appearance: Normal appearance. She is well-developed and well-groomed. She is not ill-appearing, toxic-appearing or diaphoretic.  ?Musculoskeletal:  ?   Right foot: Decreased range of motion. Normal capillary refill. Swelling, deformity, tenderness and bony tenderness present. Normal pulse.  ?   Left foot: Normal.  ?Neurological:  ?   General: No focal deficit present.  ?   Mental Status: She is alert and oriented to person, place, and time. Mental status is at baseline.  ?   Cranial Nerves: Cranial nerves 2-12 are intact.  ?   Sensory: Sensation is intact.  ?   Motor: Motor function is intact.  ?   Coordination: Coordination is intact.  ?   Deep Tendon Reflexes: Reflexes are normal and symmetric. Babinski sign absent on the right side. Babinski sign absent on the left side.  ?Psychiatric:     ?   Attention and Perception: Attention and perception normal.     ?   Mood and Affect: Mood and affect normal. Mood is not anxious. Affect is not tearful.      ?   Speech: Speech normal.     ?   Behavior: Behavior normal. Behavior is cooperative.     ?   Thought  Content: Thought content normal.     ?   Cognition and Memory: Cognition and memory normal.     ?   Judgment: Judgment normal.  ? ? ?Visual Acuity ?Right Eye Distance:   ?Left Eye Distance:   ?Bilateral Distance:   ? ?Right Eye Near:   ?Left Eye Near:    ?Bilateral Near:    ? ?UC Couse / Diagnostics / Procedures:  ?  ?EKG ? ?Radiology ?DG Ankle Complete Right ? ?Result Date: 05/23/2021 ?CLINICAL DATA:  Acute RIGHT ankle pain following fall. Initial encounter. EXAM: RIGHT ANKLE - COMPLETE 3+ VIEW COMPARISON:  None Available. FINDINGS: A fibular tip fracture is noted. Small avulsion fractures along the LATERAL calcaneus noted. No other fracture, subluxation or dislocation identified. Soft tissue swelling is noted. IMPRESSION: Fibular tip fracture and small avulsion fracture of the LATERAL calcaneus. No dislocation. Electronically Signed   By: Margarette Canada M.D.   On: 05/23/2021 17:47  ? ?DG Foot Complete Right ? ?Addendum Date: 05/23/2021   ?ADDENDUM REPORT: 05/23/2021 17:18 ADDENDUM: Clinician called to relate the information that the pain is more towards the ankle than the forefoot. Therefore, ankle films would be recommended to better evaluate the tip of the fibula and the lateral corner of the calcaneus, which could be affected by acute fracture. Electronically Signed   By: Nelson Chimes M.D.   On: 05/23/2021 17:18  ? ?Result Date: 05/23/2021 ?CLINICAL DATA:  [place] down stairs.  Lateral swelling EXAM: RIGHT FOOT COMPLETE - 3+ VIEW COMPARISON:  None Available. FINDINGS: There is no evidence of fracture or dislocation. There is no evidence of arthropathy or other focal bone abnormality. Soft tissues are unremarkable. IMPRESSION: Negative. Electronically Signed: By: Nelson Chimes M.D. On: 05/23/2021 16:54   ? ?Procedures ?Procedures (including critical care time) ? ?UC Diagnoses / Final Clinical Impressions(s)   ?I have  reviewed the triage vital signs and the nursing notes. ? ?Pertinent labs & imaging results that were available during my care of the patient were reviewed by me and considered in my medical decision making (see chart for det

## 2021-05-25 ENCOUNTER — Other Ambulatory Visit (HOSPITAL_COMMUNITY): Payer: Self-pay

## 2021-05-25 MED ORDER — TRAMADOL HCL 50 MG PO TABS
ORAL_TABLET | ORAL | 0 refills | Status: DC
  Filled 2021-05-25: qty 20, 5d supply, fill #0

## 2021-05-26 ENCOUNTER — Other Ambulatory Visit (HOSPITAL_COMMUNITY): Payer: Self-pay

## 2021-05-30 ENCOUNTER — Ambulatory Visit (HOSPITAL_COMMUNITY)
Admission: EM | Admit: 2021-05-30 | Discharge: 2021-05-30 | Disposition: A | Discharge status: Home / Self Care | Payer: No Typology Code available for payment source | Attending: Family Medicine | Admitting: Family Medicine

## 2021-05-30 ENCOUNTER — Encounter (HOSPITAL_COMMUNITY): Payer: Self-pay | Admitting: *Deleted

## 2021-05-30 ENCOUNTER — Other Ambulatory Visit: Payer: Self-pay

## 2021-05-30 DIAGNOSIS — L0100 Impetigo, unspecified: Secondary | ICD-10-CM

## 2021-05-30 DIAGNOSIS — L233 Allergic contact dermatitis due to drugs in contact with skin: Secondary | ICD-10-CM

## 2021-05-30 MED ORDER — CEPHALEXIN 250 MG PO CAPS: 250.0000 mg | ORAL_CAPSULE | Freq: Three times a day (TID) | ORAL | 0 refills | Status: AC

## 2021-05-30 MED ORDER — MUPIROCIN 2 % EX OINT: 1.0000 "application " | TOPICAL_OINTMENT | Freq: Two times a day (BID) | CUTANEOUS | 0 refills | Status: DC

## 2021-05-30 NOTE — ED Provider Notes (Signed)
?[place] ? ? ? ?CSN: [id number] ?Arrival date & time: 05/30/21  1005 ? ? ?  ? ?History   ?Chief Complaint ?Chief Complaint  ?Patient presents with  ? rash on Lt anterior arm  ? ? ?HPI ?Khilynn Borntreger is a 56 y.o. female.  ? ?HPI ?Here for rash and irritation on her left forearm in the flexural space. ? ?On May 7 she fell down some old concrete stairs and sustained a fracture to her right fibula and also sustained an abrasion to the left arm and that antecubital space. ? ?She had been placing Polysporin on it, and then it started itching and became more like a rash.  It turned out that she had been allergic to neomycin already ? ?She then started applying bacitracin by itself and hydrocortisone over-the-counter.  It has had a golden crust on it in the last 3 to 4 days.  Also it is been more itchy.  She has some rash extending down onto the more distal forearm now.  No fever or chills ?Past Medical History:  ?Diagnosis Date  ? Anxiety   ? Breast mass, right   ? Genital warts   ? GERD (gastroesophageal reflux disease)   ? Glaucoma   ? Headache(784.0)   ? Smoker   ? Ulcer   ? ? ?Patient Active Problem List  ? Diagnosis Date Noted  ? Hyperlipidemia 10/09/2020  ? Atypical ductal hyperplasia of right breast 08/07/2020  ? Vitamin D deficiency 12/26/2017  ? Family history of breast cancer in mother- age 34 12/21/2017  ? Family history of early CAD-  maternal uncles in 60s, paternal uncles in 20s 12/21/2017  ? Tachycardia 12/21/2017  ? Sweating abnormality 12/21/2017  ? Irregular heart rate 12/21/2017  ? Perimenopausal disorder 12/21/2017  ? Loud snoring 12/21/2017  ? Polyarthralgia 07/02/2014  ? Morning stiffness of joints 07/02/2014  ? Family history of rheumatic joint disease 07/02/2014  ? Polyuria 07/02/2014  ? chronic fatigue 07/02/2014  ? Mood swings 07/02/2014  ? Smoker 07/02/2014  ? GERD (gastroesophageal reflux disease) 06/20/2010  ? Reactive hypoglycemia 06/20/2010  ? Paresthesias 06/20/2010  ?  Abnormal thyroid stimulating hormone (TSH) level 06/20/2010  ? Tobacco use disorder 06/20/2010  ? ? ?Past Surgical History:  ?Procedure Laterality Date  ? BREAST LUMPECTOMY  1998  ? BREAST LUMPECTOMY WITH RADIOACTIVE SEED LOCALIZATION Right 03/14/2019  ? Procedure: RIGHT BREAST LUMPECTOMY WITH RADIOACTIVE SEED LOCALIZATION;  Surgeon: Erroll Luna, MD;  Location: [facility];  Service: General;  Laterality: Right;  ? DIAGNOSTIC LAPAROSCOPY    ? TONSILLECTOMY    ? ? ?OB History   ?No obstetric history on file. ?  ? ? ? ?Home Medications   ? ?Prior to Admission medications   ?Medication Sig Start Date End Date Taking? Authorizing Provider  ?cephALEXin (KEFLEX) 250 MG capsule Take 1 capsule (250 mg total) by mouth 3 (three) times daily for 7 days. 05/30/21 06/06/21 Yes Barrett Henle, MD  ?mupirocin ointment (BACTROBAN) 2 % Apply 1 application. topically 2 (two) times daily. To affected area till better 05/30/21  Yes Alynah Schone, Gwenlyn Perking, MD  ?CALCIUM PO Take by mouth.    [provider]  ?escitalopram (LEXAPRO) 10 MG tablet Take 1 tablet (10 mg total) by mouth daily. 10/08/20   Isaac Bliss, Rayford Halsted, MD  ?traMADol Veatrice Bourbon) 50 MG tablet Take 1 tablet every 6 hours by oral route for 3 days. 05/25/21     ? ? ?Family History ?Family History  ?  Problem Relation Age of Onset  ? Breast cancer Mother 68  ? Cancer Mother   ?     breast  ? Heart disease Father   ? Hyperlipidemia Father   ? Nephritis Sister   ? Heart disease Maternal Uncle   ? Cancer Maternal Uncle   ? Hyperlipidemia Maternal Uncle   ? Hypertension Maternal Uncle   ? Heart disease Paternal Uncle   ? Diabetes Maternal Grandmother   ? Heart disease Maternal Grandfather   ? Cancer Maternal Grandfather   ? Hyperlipidemia Maternal Grandfather   ? Hypertension Maternal Grandfather   ? Throat cancer Maternal Grandfather   ? Lung cancer Maternal Grandfather   ? Heart disease Paternal Grandfather   ? Cancer Paternal Grandfather   ? Colon cancer  Neg Hx   ? Colon polyps Neg Hx   ? Stomach cancer Neg Hx   ? Rectal cancer Neg Hx   ? ? ?Social History ?Social History  ? ?Tobacco Use  ? Smoking status: Every Day  ?  Packs/day: 1.00  ?  Years: 30.00  ?  Pack years: 30.00  ?  Types: Cigarettes  ? Smokeless tobacco: Never  ?Vaping Use  ? Vaping Use: Never used  ?Substance Use Topics  ? Alcohol use: Yes  ?  Alcohol/week: 12.0 standard drinks  ?  Types: 12 Standard drinks or equivalent per week  ? Drug use: No  ? ? ? ?Allergies   ?Bee venom and Neomycin ? ? ?Review of Systems ?Review of Systems ? ? ?Physical Exam ?Triage Vital Signs ?ED Triage Vitals  ?Enc Vitals Group  ?   BP 05/30/21 1043 (!) 147/81  ?   Pulse Rate 05/30/21 1043 81  ?   Resp 05/30/21 1043 18  ?   Temp 05/30/21 1043 98.3 ?F (36.8 ?C)  ?   Temp src --   ?   SpO2 05/30/21 1043 98 %  ?   Weight --   ?   Height --   ?   Head Circumference --   ?   Peak Flow --   ?   Pain Score 05/30/21 1041 2  ?   Pain Loc --   ?   Pain Edu? --   ?   Excl. in [place]? --   ? ?No data found. ? ?Updated Vital Signs ?BP (!) 147/81   Pulse 81   Temp 98.3 ?F (36.8 ?C)   Resp 18   LMP 07/06/2018   SpO2 98%  ? ?Visual Acuity ?Right Eye Distance:   ?Left Eye Distance:   ?Bilateral Distance:   ? ?Right Eye Near:   ?Left Eye Near:    ?Bilateral Near:    ? ?Physical Exam ?Vitals reviewed.  ?Constitutional:   ?   General: She is not in acute distress. ?   Appearance: She is not toxic-appearing or diaphoretic.  ?Skin: ?   Coloration: Skin is not pale.  ?   Comments: She has a bumpy rash area on her antecubital space and proximal forearm.  His dimensions are approximately 8 cm x 5 cm.  In the middle of it there is some abrasion and unroofed eschar.  With a little bit of a golden tent to the eschar.  There are satellite erythematous bumps also laterally medially and proximally to that and there is some rash that is finer and mildly erythematous extending distally from the main area of rash.  ?Neurological:  ?   Mental Status: She is  alert and  oriented to person, place, and time.  ?Psychiatric:     ?   Behavior: Behavior normal.  ? ? ? ?UC Treatments / Results  ?Labs ?(all labs ordered are listed, but only abnormal results are displayed) ?Labs Reviewed - No data to display ? ?EKG ? ? ?Radiology ?No results found. ? ?Procedures ?Procedures (including critical care time) ? ?Medications Ordered in UC ?Medications - No data to display ? ?Initial Impression / Assessment and Plan / UC Course  ?I have reviewed the triage vital signs and the nursing notes. ? ?Pertinent labs & imaging results that were available during my care of the patient were reviewed by me and considered in my medical decision making (see chart for details). ? ?  ? ?I do want to cover for some impetigo and possible secondary infection.  She is going to continue to use the hydrocortisone over-the-counter on the surrounding areas that are pruritic.  She also will take Benadryl as needed ?Final Clinical Impressions(s) / UC Diagnoses  ? ?Final diagnoses:  ?Impetigo  ?Allergic contact dermatitis due to drugs in contact with skin  ? ? ? ?Discharge Instructions   ? ?  ?Wash the area with warm soapy water 1 or 2 times a day, and then apply new mupirocin ointment, especially to the open areas. ? ?Take cephalexin 250 mg--1 capsule 3 times daily for 7 days. ? ?Continue to use the hydrocortisone ointment on the surrounding areas that are itchy ? ? ? ? ?ED Prescriptions   ? ? Medication Sig Dispense Auth. Provider  ? mupirocin ointment (BACTROBAN) 2 % Apply 1 application. topically 2 (two) times daily. To affected area till better 22 g Barrett Henle, MD  ? cephALEXin (KEFLEX) 250 MG capsule Take 1 capsule (250 mg total) by mouth 3 (three) times daily for 7 days. 21 capsule Barrett Henle, MD  ? ?  ? ?PDMP not reviewed this encounter. ?  ?Barrett Henle, MD ?05/30/21 1111 ? ?

## 2021-05-30 NOTE — Discharge Instructions (Addendum)
Wash the area with warm soapy water 1 or 2 times a day, and then apply new mupirocin ointment, especially to the open areas. ? ?Take cephalexin 250 mg--1 capsule 3 times daily for 7 days. ? ?Continue to use the hydrocortisone ointment on the surrounding areas that are itchy ?

## 2021-05-30 NOTE — ED Triage Notes (Signed)
PT has used OTC for skin with no improvement. ?

## 2021-06-02 ENCOUNTER — Telehealth (HOSPITAL_COMMUNITY): Payer: Self-pay | Admitting: Emergency Medicine

## 2021-06-02 NOTE — Telephone Encounter (Signed)
Received message from patient access that patient is experiencing an allergic reaction to her abx.   ?Attempted to reach patient x 1 around 1230pm today, LVM ?Attempted again this evening, same.    ?Per Dr. Mannie Stabile, we should wait until patient returns call to describe allergic reaction to be able to decide on changes.   ?

## 2021-06-03 ENCOUNTER — Other Ambulatory Visit: Payer: Self-pay

## 2021-06-03 ENCOUNTER — Emergency Department (HOSPITAL_BASED_OUTPATIENT_CLINIC_OR_DEPARTMENT_OTHER)
Admission: EM | Admit: 2021-06-03 | Discharge: 2021-06-03 | Disposition: A | Discharge status: Home / Self Care | Payer: No Typology Code available for payment source | Attending: Emergency Medicine | Admitting: Emergency Medicine

## 2021-06-03 ENCOUNTER — Ambulatory Visit: Payer: No Typology Code available for payment source | Admitting: Internal Medicine

## 2021-06-03 ENCOUNTER — Other Ambulatory Visit (HOSPITAL_BASED_OUTPATIENT_CLINIC_OR_DEPARTMENT_OTHER): Payer: Self-pay

## 2021-06-03 ENCOUNTER — Encounter (HOSPITAL_BASED_OUTPATIENT_CLINIC_OR_DEPARTMENT_OTHER): Payer: Self-pay | Admitting: *Deleted

## 2021-06-03 DIAGNOSIS — S40812D Abrasion of left upper arm, subsequent encounter: Secondary | ICD-10-CM | POA: Insufficient documentation

## 2021-06-03 DIAGNOSIS — R21 Rash and other nonspecific skin eruption: Secondary | ICD-10-CM | POA: Insufficient documentation

## 2021-06-03 DIAGNOSIS — R000 Tachycardia, unspecified: Secondary | ICD-10-CM | POA: Insufficient documentation

## 2021-06-03 DIAGNOSIS — W108XXD Fall (on) (from) other stairs and steps, subsequent encounter: Secondary | ICD-10-CM | POA: Diagnosis not present

## 2021-06-03 DIAGNOSIS — T7840XA Allergy, unspecified, initial encounter: Secondary | ICD-10-CM

## 2021-06-03 DIAGNOSIS — T368X5A Adverse effect of other systemic antibiotics, initial encounter: Secondary | ICD-10-CM | POA: Insufficient documentation

## 2021-06-03 DIAGNOSIS — Z720 Tobacco use: Secondary | ICD-10-CM | POA: Insufficient documentation

## 2021-06-03 MED ORDER — PREDNISONE 20 MG PO TABS
40.0000 mg | ORAL_TABLET | Freq: Every day | ORAL | 0 refills | Status: DC
  Filled 2021-06-03: qty 10, 5d supply, fill #0

## 2021-06-03 MED ORDER — DIPHENHYDRAMINE HCL 50 MG/ML IJ SOLN
25.0000 mg | Freq: Once | INTRAMUSCULAR | Status: AC
  Administered 2021-06-03: 25 mg via INTRAVENOUS
  Filled 2021-06-03: qty 1

## 2021-06-03 MED ORDER — METHYLPREDNISOLONE SODIUM SUCC 125 MG IJ SOLR
125.0000 mg | Freq: Once | INTRAMUSCULAR | Status: AC
  Administered 2021-06-03: 125 mg via INTRAVENOUS
  Filled 2021-06-03: qty 2

## 2021-06-03 MED ORDER — FAMOTIDINE IN NACL 20-0.9 MG/50ML-% IV SOLN
20.0000 mg | Freq: Once | INTRAVENOUS | Status: AC
Start: 2021-06-03 — End: 2021-06-03
  Administered 2021-06-03: 20 mg via INTRAVENOUS
  Filled 2021-06-03: qty 50

## 2021-06-03 NOTE — Telephone Encounter (Signed)
Upon further review, patient was seen in the ED today and issues addressed

## 2021-06-03 NOTE — ED Notes (Signed)
Patient verbalizes understanding of discharge instructions. Opportunity for questioning and answers were provided. Patient discharged from ED.  °

## 2021-06-03 NOTE — ED Triage Notes (Signed)
Pt has abrasion to left forearm from falling down the stairs. Pt was seen and applied antibiotic ointment and pt sates that she had an allergic reaction (know allergy to neosporin) but pt then had blisters on her forearm after bacitracin.  Pt was then seen due to blisters and irritation to her forearm abrasion which she felt was an allergic reaction and MD at [facility] felt it was impentigo (rather than an allergy) and placed her on Keflex.  Pt then began having a rash on her chest,  legs and neck which looked like blisters.  Pt feels that this was an allergic reaction to the antibiotic and she stopped taking the antibiotic Tuesday but feels that her rash is getting worse and she describes not feeling quite right and having nausea, feeling anxious.  Blisters spreading per pt.

## 2021-06-03 NOTE — Discharge Instructions (Signed)
Use vasoline on the wound as it appears to be healing well.  Avoid kelfex.  You are getting 5 days of steroids to help with the allergic reaction.

## 2021-06-03 NOTE — ED Provider Notes (Signed)
[facility] EMERGENCY DEPT Provider Note   CSN: [id number] Arrival date & time: 06/03/21  0930     History  Chief Complaint  Patient presents with   Rash    Katelyn Hodges is a 56 y.o. female.  Patient is a 56 year old female with a history of GERD, anxiety and tobacco use who presents today for worsening rash, general malaise and nausea.  Patient reports that at the beginning of the month she fell and broke her fibula and scraped her left arm on concrete.  She was seen at urgent care 4 days ago because the abrasion to her arm which is on the extensor surface was getting worse.  She had been applying Neosporin and at that time it was thought that she was having a contact reaction to the Neosporin but also concern for possible impetigo.  She was started on Keflex at that visit.  Patient reports she took a dose on Sunday, 3 tablets on Monday and 2 on Tuesday but she was already starting to break out in an itchy rash.  She was placing hydrocortisone cream on the area on her arm as well and mupirocin.  Patient reports that she has not had any antibiotics since Tuesday but the rash is getting worse and is now all over her trunk and on her extremities and last night she even felt like it might of been somewhat difficult to breathe and she has felt nauseated today and just generally unwell.  She has never had an allergic reaction like this but reports she is very sensitive to things.  No skin sloughing or blisters at this time.  She does note that the area on her arm seems to be getting much better.  The history is provided by the patient and medical records.  Rash     Home Medications Prior to Admission medications   Medication Sig Start Date End Date Taking? Authorizing Provider  predniSONE (DELTASONE) 20 MG tablet Take 2 tablets (40 mg total) by mouth daily. Start on 5/19 06/03/21  Yes Blanchie Dessert, MD  CALCIUM PO Take by mouth.    [provider]  cephALEXin  (KEFLEX) 250 MG capsule Take 1 capsule (250 mg total) by mouth 3 (three) times daily for 7 days. 05/30/21 06/06/21  Barrett Henle, MD  escitalopram (LEXAPRO) 10 MG tablet Take 1 tablet (10 mg total) by mouth daily. 10/08/20   Isaac Bliss, Rayford Halsted, MD  mupirocin ointment (BACTROBAN) 2 % Apply 1 application. topically 2 (two) times daily. To affected area till better 05/30/21   Barrett Henle, MD  traMADol (ULTRAM) 50 MG tablet Take 1 tablet every 6 hours by oral route for 3 days. 05/25/21         Allergies    Bee venom, Bactroban [mupirocin], Keflex [cephalexin], and Neomycin    Review of Systems   Review of Systems  Skin:  Positive for rash.   Physical Exam Updated Vital Signs BP 124/73 (BP Location: Left Arm)   Pulse 70   Temp 98 F (36.7 C) (Oral)   Resp 14   LMP 07/06/2018   SpO2 100%  Physical Exam Vitals and nursing note reviewed.  Constitutional:      General: She is not in acute distress.    Appearance: She is well-developed.  HENT:     Head: Normocephalic and atraumatic.     Mouth/Throat:     Mouth: Mucous membranes are moist.     Comments: No skin lesions or blisters  present in the mouth.  Posterior pharynx and uvula without swelling Eyes:     Pupils: Pupils are equal, round, and reactive to light.  Cardiovascular:     Rate and Rhythm: Regular rhythm. Tachycardia present.     Heart sounds: Normal heart sounds. No murmur heard.   No friction rub.  Pulmonary:     Effort: Pulmonary effort is normal.     Breath sounds: Normal breath sounds. No wheezing or rales.  Abdominal:     General: Bowel sounds are normal. There is no distension.     Palpations: Abdomen is soft.     Tenderness: There is no abdominal tenderness. There is no guarding or rebound.  Musculoskeletal:        General: No tenderness. Normal range of motion.       Arms:     Comments: No edema  Skin:    General: Skin is warm and dry.     Findings: Rash present.     Comments: Maculopapular  blanching rash present generalized over the body but most pronounced on the abdomen and chest.  No pustules or vesicles present.  Neurological:     Mental Status: She is alert and oriented to person, place, and time. Mental status is at baseline.     Cranial Nerves: No cranial nerve deficit.  Psychiatric:        Mood and Affect: Mood normal.        Behavior: Behavior normal.    ED Results / Procedures / Treatments   Labs (all labs ordered are listed, but only abnormal results are displayed) Labs Reviewed - No data to display  EKG None  Radiology No results found.  Procedures Procedures    Medications Ordered in ED Medications  diphenhydrAMINE (BENADRYL) injection 25 mg (25 mg Intravenous Given 06/03/21 0957)  methylPREDNISolone sodium succinate (SOLU-MEDROL) 125 mg/2 mL injection 125 mg (125 mg Intravenous Given 06/03/21 0958)  famotidine (PEPCID) IVPB 20 mg premix (0 mg Intravenous Stopped 06/03/21 1039)    ED Course/ Medical Decision Making/ A&P                           Medical Decision Making Amount and/or Complexity of Data Reviewed External Data Reviewed: notes.  Risk Prescription drug management.   Patient is a 56 year old female presenting today with symptoms most classic for an allergic reaction.  The allergic reaction is most likely to Keflex which is the only new medication she has started in the period where she is developed symptoms.  No evidence of Stevens-Johnson's or TEN today.  No findings to suggest anaphylaxis.  Patient has discontinued the antibiotics approximately 36 hours ago.  Wound on her left arm appears to be healing it does not appear to be secondarily infected at this time.  Oxygen saturation 100% and patient is in no acute distress.  Patient given Solu-Medrol, Benadryl and Pepcid.  Will reevaluate.  External medical records from urgent care visit 4 days ago were reviewed.  11:01 AM Pt improved will d/c home.       Final Clinical  Impression(s) / ED Diagnoses Final diagnoses:  Allergic reaction, initial encounter    Rx / DC Orders ED Discharge Orders          Ordered    predniSONE (DELTASONE) 20 MG tablet  Daily        06/03/21 1100              Blanchie Dessert, MD  06/03/21 1101  

## 2021-06-08 ENCOUNTER — Ambulatory Visit: Payer: No Typology Code available for payment source | Admitting: Dermatology

## 2021-06-08 ENCOUNTER — Encounter: Payer: Self-pay | Admitting: Dermatology

## 2021-06-08 ENCOUNTER — Ambulatory Visit (INDEPENDENT_AMBULATORY_CARE_PROVIDER_SITE_OTHER): Payer: No Typology Code available for payment source | Admitting: Dermatology

## 2021-06-08 DIAGNOSIS — D1800 Hemangioma unspecified site: Secondary | ICD-10-CM | POA: Diagnosis not present

## 2021-06-08 DIAGNOSIS — I781 Nevus, non-neoplastic: Secondary | ICD-10-CM

## 2021-06-08 DIAGNOSIS — T07XXXA Unspecified multiple injuries, initial encounter: Secondary | ICD-10-CM

## 2021-06-08 DIAGNOSIS — Z1283 Encounter for screening for malignant neoplasm of skin: Secondary | ICD-10-CM | POA: Diagnosis not present

## 2021-07-04 ENCOUNTER — Encounter: Payer: Self-pay | Admitting: Dermatology

## 2021-07-04 NOTE — Progress Notes (Signed)
   New Patient   Subjective  Katelyn Hodges is a 56 y.o. female who presents for the following: New Patient (Initial Visit) (Patient here today for skin check, per patient she fell on May 7th and ended up with a broken ankle and abrasion on her left forearm that still healing. Per patient she has a history of impetigo on her left forearm after the abrasion where she used neosporin and polysporin which she is allergic to, per patient she was also given Keflex for her forearm and developed a full body rash. 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  Skin check plus check abrasions from recent fall on arms Location:  Duration:  Quality:  Associated Signs/Symptoms: Modifying Factors:  Severity:  Timing: Context:    The following portions of the chart were reviewed this encounter and updated as appropriate:  Tobacco  Allergies  Meds  Problems  Med Hx  Surg Hx  Fam Hx      Objective  Well appearing patient in no apparent distress; mood and affect are within normal limits. Full body skin examination -no atypical pigmented lesions (all checked with dermoscopy), no nonmelanoma skin cancer  Multiple 2 mm red dermal papules, typical dermoscopy  Mid Tip of Nose Blanchable, nonpalpable 2 mm telangiectasia tip of nose.  Left Forearm - Posterior, Right Elbow - Posterior Healing abrasions without current sign of secondary infection or impetiginization    A full examination was performed including scalp, head, eyes, ears, nose, lips, neck, chest, axillae, abdomen, back, buttocks, bilateral upper extremities, bilateral lower extremities, hands, feet, fingers, toes, fingernails, and toenails. All findings within normal limits unless otherwise noted below.  Areas beneath undergarments not fully examined.   Assessment & Plan  Encounter for screening for malignant neoplasm of skin  Annual skin examination.  Encouraged to self examine twice  annually.  Continue ultraviolet protection.  Hemangioma, unspecified site  No intervention necessary  Telangiectasia Mid Tip of Nose  Patient knows that telangiectasia can be seen with basal cell carcinoma so if there is growth or bleeding she will return for biopsy  Multiple abrasions (2) Right Elbow - Posterior; Left Forearm - Posterior  She is quite knowledgeable about medical conditions particularly skin so she will call me if there is worsening.

## 2021-07-16 ENCOUNTER — Encounter: Payer: Self-pay | Admitting: Internal Medicine

## 2021-07-27 ENCOUNTER — Telehealth: Payer: Self-pay | Admitting: Hematology and Oncology

## 2021-07-27 NOTE — Telephone Encounter (Signed)
Per 7/11 phone line pt called to cancel appointment.  Appointment canceled per pt request

## 2021-08-06 ENCOUNTER — Ambulatory Visit: Payer: No Typology Code available for payment source | Admitting: Hematology and Oncology

## 2021-08-09 NOTE — Progress Notes (Signed)
56 y.o. G0. Single White or Caucasian Not Hispanic or Latino female here for annual exam. No vaginal bleeding. Not currently sexually active, broke up with her partner.   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 She has a 31% lifetime risk of breast cancer and is followed by Dr Lindi Adie in Oncology. She declined Tamoxifen therapy secondary to vasomotor symptoms.  He recommended yearly breast MRI's, mammograms and exams.   Vasomotor symptoms have improved, tolerable.     Patient's last menstrual period was 07/06/2018.          Sexually active: No.  The current method of family planning is post menopausal status.    Exercising: Yes.    The patient has a physically strenuous job, but has no regular exercise apart from work.  Smoker:  yes, 1/2 PPD. Having a lung scan on Friday. She is trying to quit.   Health Maintenance: Pap:  06/11/19 normal, no hpv testing.  History of abnormal Pap:  yes, cryosurgery of her cervix as a teen.  MMG:  01/22/2021 density B Bi-rads 1 neg  BMD:   11/12/19 Osteopenia, T score -1.6 Colonoscopy: 02/26/21 polyp f/u 10 years  TDaP:  07/17/17  Gardasil: none    reports that she has been smoking cigarettes. She has a 30.00 pack-year smoking history. She has never used smokeless tobacco. She reports current alcohol use of about 12.0 standard drinks of alcohol per week. She reports that she does not use drugs.  Having 9-12 drinks a week. 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   Past Medical History:  Diagnosis Date   Anxiety    Atypical mole    Breast mass, right    Genital warts    GERD (gastroesophageal reflux disease)    Glaucoma    Headache(784.0)    Smoker    Ulcer     Past Surgical History:  Procedure Laterality Date   BREAST LUMPECTOMY  1998   BREAST LUMPECTOMY WITH RADIOACTIVE SEED LOCALIZATION Right 03/14/2019   Procedure: RIGHT BREAST LUMPECTOMY WITH RADIOACTIVE SEED LOCALIZATION;  Surgeon: Erroll Luna,  MD;  Location: [facility];  Service: General;  Laterality: Right;   DIAGNOSTIC LAPAROSCOPY     TONSILLECTOMY      Current Outpatient Medications  Medication Sig Dispense Refill   CALCIUM PO Take by mouth.     escitalopram (LEXAPRO) 10 MG tablet Take 1 tablet (10 mg total) by mouth daily. 90 tablet 1   VITAMIN D PO Take by mouth.     No current facility-administered medications for this visit.    Family History  Problem Relation Age of Onset   Breast cancer Mother 13   Cancer Mother        breast   Heart disease Father    Hyperlipidemia Father    Nephritis Sister    Heart disease Maternal Uncle    Cancer Maternal Uncle    Hyperlipidemia Maternal Uncle    Hypertension Maternal Uncle    Heart disease Paternal Uncle    Diabetes Maternal Grandmother    Heart disease Maternal Grandfather    Cancer Maternal Grandfather    Hyperlipidemia Maternal Grandfather    Hypertension Maternal Grandfather    Throat cancer Maternal Grandfather    Lung cancer Maternal Grandfather    Heart disease Paternal Grandfather    Cancer Paternal Grandfather    Colon cancer Neg Hx    Colon polyps Neg Hx    Stomach cancer  Neg Hx    Rectal cancer Neg Hx     Review of Systems  All other systems reviewed and are negative.   Exam:   BP 128/74   Pulse 75   Ht 5' 7.5" (1.715 m)   Wt 153 lb (69.4 kg)   LMP 07/06/2018   SpO2 99%   BMI 23.61 kg/m   Weight change: '@WEIGHTCHANGE'$ @ Height:   Height: 5' 7.5" (171.5 cm)  Ht Readings from Last 3 Encounters:  08/16/21 5' 7.5" (1.715 m)  02/26/21 5' 8.5" (1.74 m)  02/12/21 5' 8.5" (1.74 m)    General appearance: alert, cooperative and appears stated age Head: Normocephalic, without obvious abnormality, atraumatic Neck: no adenopathy, supple, symmetrical, trachea midline and thyroid normal to inspection and palpation Lungs: clear to auscultation bilaterally Cardiovascular: regular rate and rhythm Breasts: normal appearance, no masses or  tenderness Abdomen: soft, non-tender; non distended,  no masses,  no organomegaly Extremities: extremities normal, atraumatic, no cyanosis or edema Skin: Skin color, texture, turgor normal. No rashes or lesions Lymph nodes: Cervical, supraclavicular, and axillary nodes normal. No abnormal inguinal nodes palpated Neurologic: Grossly normal   Pelvic: External genitalia:  no lesions              Urethra:  normal appearing urethra with no masses, tenderness or lesions              Bartholins and Skenes: normal                 Vagina: normal appearing vagina with normal color and discharge, no lesions              Cervix: no lesions               Bimanual Exam:  Uterus:  normal size, contour, position, consistency, mobility, non-tender              Adnexa: no mass, fullness, tenderness               Rectovaginal: Confirms               Anus:  normal sphincter tone, no lesions    1. Well woman exam Discussed breast self exam Discussed calcium and vit D intake Labs with primary  2. Atypical ductal hyperplasia of right breast 31% risk of breast cancer Mammogram is UTD Will schedule breast MRI  3. History of osteopenia - DG Bone Density; Future  4. Hypoestrogenism - DG Bone Density; Future  5. Screening examination for STD (sexually transmitted disease) - SURESWAB CT/NG/T. vaginalis - RPR - HIV Antibody (routine testing w rflx) - Hepatitis C antibody - HSV(herpes simplex vrs) 1+2 ab-IgG

## 2021-08-11 ENCOUNTER — Other Ambulatory Visit: Payer: Self-pay | Admitting: Internal Medicine

## 2021-08-11 ENCOUNTER — Other Ambulatory Visit (HOSPITAL_COMMUNITY): Payer: Self-pay

## 2021-08-11 DIAGNOSIS — R4586 Emotional lability: Secondary | ICD-10-CM

## 2021-08-11 MED ORDER — ESCITALOPRAM OXALATE 10 MG PO TABS
10.0000 mg | ORAL_TABLET | Freq: Every day | ORAL | 1 refills | Status: DC
  Filled 2021-08-11: qty 90, 90d supply, fill #0

## 2021-08-16 ENCOUNTER — Ambulatory Visit (INDEPENDENT_AMBULATORY_CARE_PROVIDER_SITE_OTHER): Payer: No Typology Code available for payment source | Admitting: Obstetrics and Gynecology

## 2021-08-16 ENCOUNTER — Encounter: Payer: Self-pay | Admitting: Obstetrics and Gynecology

## 2021-08-16 VITALS — BP 128/74 | HR 75 | Ht 67.5 in | Wt 153.0 lb

## 2021-08-16 DIAGNOSIS — N6091 Unspecified benign mammary dysplasia of right breast: Secondary | ICD-10-CM | POA: Diagnosis not present

## 2021-08-16 DIAGNOSIS — Z01419 Encounter for gynecological examination (general) (routine) without abnormal findings: Secondary | ICD-10-CM

## 2021-08-16 DIAGNOSIS — E2839 Other primary ovarian failure: Secondary | ICD-10-CM

## 2021-08-16 DIAGNOSIS — Z113 Encounter for screening for infections with a predominantly sexual mode of transmission: Secondary | ICD-10-CM

## 2021-08-16 DIAGNOSIS — Z8739 Personal history of other diseases of the musculoskeletal system and connective tissue: Secondary | ICD-10-CM

## 2021-08-16 NOTE — Patient Instructions (Signed)

## 2021-08-17 ENCOUNTER — Telehealth: Payer: Self-pay | Admitting: *Deleted

## 2021-08-17 DIAGNOSIS — Z9189 Other specified personal risk factors, not elsewhere classified: Secondary | ICD-10-CM

## 2021-08-17 DIAGNOSIS — Z803 Family history of malignant neoplasm of breast: Secondary | ICD-10-CM

## 2021-08-17 LAB — SURESWAB CT/NG/T. VAGINALIS
C. trachomatis RNA, TMA: NOT DETECTED
N. gonorrhoeae RNA, TMA: NOT DETECTED
Trichomonas vaginalis RNA: NOT DETECTED

## 2021-08-17 LAB — HSV(HERPES SIMPLEX VRS) I + II AB-IGG
HAV 1 IGG,TYPE SPECIFIC AB: 25.7 index — ABNORMAL HIGH
HSV 2 IGG,TYPE SPECIFIC AB: 2.21 index — ABNORMAL HIGH

## 2021-08-17 LAB — HEPATITIS C ANTIBODY: Hepatitis C Ab: NONREACTIVE

## 2021-08-17 LAB — HIV ANTIBODY (ROUTINE TESTING W REFLEX): HIV 1&2 Ab, 4th Generation: NONREACTIVE

## 2021-08-17 LAB — RPR: RPR Ser Ql: NONREACTIVE

## 2021-08-17 NOTE — Telephone Encounter (Signed)
-----   Message from Salvadore Dom, MD sent at 08/16/2021  4:25 PM EDT ----- Please schedule her for a breast MRI, 31% risk of breast cancer.  Thanks, Sharee Pimple

## 2021-08-17 NOTE — Telephone Encounter (Signed)
Order placed at [facility] imaging they will call patient to schedule.

## 2021-08-18 ENCOUNTER — Encounter: Payer: Self-pay | Admitting: Obstetrics and Gynecology

## 2021-08-20 ENCOUNTER — Other Ambulatory Visit: Payer: No Typology Code available for payment source

## 2021-08-20 NOTE — Telephone Encounter (Signed)
MRI scheduled on 09/17/21

## 2021-09-17 ENCOUNTER — Inpatient Hospital Stay: Admission: RE | Admit: 2021-09-17 | Payer: No Typology Code available for payment source | Source: Ambulatory Visit

## 2021-09-17 ENCOUNTER — Other Ambulatory Visit: Payer: No Typology Code available for payment source

## 2021-09-23 ENCOUNTER — Other Ambulatory Visit: Payer: No Typology Code available for payment source

## 2021-12-17 ENCOUNTER — Inpatient Hospital Stay: Admission: RE | Admit: 2021-12-17 | Payer: No Typology Code available for payment source | Source: Ambulatory Visit

## 2021-12-17 ENCOUNTER — Other Ambulatory Visit: Payer: No Typology Code available for payment source

## 2022-01-04 ENCOUNTER — Ambulatory Visit (INDEPENDENT_AMBULATORY_CARE_PROVIDER_SITE_OTHER): Payer: No Typology Code available for payment source | Admitting: Family Medicine

## 2022-01-04 ENCOUNTER — Ambulatory Visit: Payer: No Typology Code available for payment source | Admitting: Internal Medicine

## 2022-01-04 VITALS — BP 132/80 | HR 84 | Temp 98.4°F | Ht 67.5 in | Wt 151.3 lb

## 2022-01-04 DIAGNOSIS — R059 Cough, unspecified: Secondary | ICD-10-CM

## 2022-01-04 DIAGNOSIS — R519 Headache, unspecified: Secondary | ICD-10-CM

## 2022-01-04 LAB — POCT INFLUENZA A/B
Influenza A, POC: NEGATIVE
Influenza B, POC: NEGATIVE

## 2022-01-04 LAB — POC COVID19 BINAXNOW: SARS Coronavirus 2 Ag: NEGATIVE

## 2022-01-04 NOTE — Progress Notes (Signed)
Established Patient Office Visit  Subjective   Patient ID: Katelyn Hodges, female    DOB: 02/08/1965  Age: 56 y.o. MRN: [id number]  Chief Complaint  Patient presents with   Cough    Patient complains of cough, Productive cough with clear sputum, x3 days    Nasal Congestion    Patient complains of nasal congestion, x3 days    Headache    Patient complains of headaches, x3 days   Fatigue         HPI   Katelyn Hodges is seen as a work in with upper respiratory symptoms.  She had onset of some symptoms Wednesday prior to Thanksgiving.  Seemed to get some better and current symptoms started this past weekend.  She noted sore throat on Saturday followed by cough and some nasal congestion and headache.  No fever.  COVID and influenza testing here today negative.  She does have a history of smoking.  Denies any nausea, vomiting, or diarrhea.  No dyspnea.  Past Medical History:  Diagnosis Date   Anxiety    Atypical mole    Breast mass, right    Genital warts    GERD (gastroesophageal reflux disease)    Glaucoma    Headache(784.0)    Smoker    Ulcer    Past Surgical History:  Procedure Laterality Date   BREAST LUMPECTOMY  1998   BREAST LUMPECTOMY WITH RADIOACTIVE SEED LOCALIZATION Right 03/14/2019   Procedure: RIGHT BREAST LUMPECTOMY WITH RADIOACTIVE SEED LOCALIZATION;  Surgeon: Erroll Luna, MD;  Location: [facility];  Service: General;  Laterality: Right;   DIAGNOSTIC LAPAROSCOPY     TONSILLECTOMY      reports that she has been smoking cigarettes. She has a 30.00 pack-year smoking history. She has never used smokeless tobacco. She reports current alcohol use of about 12.0 standard drinks of alcohol per week. She reports that she does not use drugs. family history includes Breast cancer (age of onset: 51) in her mother; Cancer in her maternal grandfather, maternal uncle, mother, and paternal grandfather; Diabetes in her maternal grandmother; Heart disease in her father,  maternal grandfather, maternal uncle, paternal grandfather, and paternal uncle; Hyperlipidemia in her father, maternal grandfather, and maternal uncle; Hypertension in her maternal grandfather and maternal uncle; Lung cancer in her maternal grandfather; Nephritis in her sister; Throat cancer in her maternal grandfather. Allergies  Allergen Reactions   Bee Venom Anaphylaxis   Neomy-Bacit-Polymyx-Pramoxine    Bactroban [Mupirocin] Rash   Keflex [Cephalexin] Rash   Neomycin Rash    Review of Systems  Constitutional:  Negative for chills and fever.  HENT:  Positive for congestion.   Respiratory:  Positive for cough.       Objective:     BP 132/80 (BP Location: Left Arm, Patient Position: Sitting, Cuff Size: Normal)   Pulse 84   Temp 98.4 F (36.9 C) (Oral)   Ht 5' 7.5" (1.715 m)   Wt 151 lb 4.8 oz (68.6 kg)   LMP 07/06/2018   SpO2 99%   BMI 23.35 kg/m    Physical Exam Vitals reviewed.  Constitutional:      General: She is not in acute distress.    Appearance: She is well-developed. She is not ill-appearing.  Neck:     Comments: Minimal anterior cervical adenopathy Cardiovascular:     Rate and Rhythm: Normal rate.  Pulmonary:     Effort: Pulmonary effort is normal.     Breath sounds: Normal breath sounds. No wheezing or  rales.  Musculoskeletal:     Cervical back: Neck supple.  Neurological:     Mental Status: She is alert.      Results for orders placed or performed in visit on 01/04/22  POC COVID-19  Result Value Ref Range   SARS Coronavirus 2 Ag Negative Negative  POC Influenza A/B  Result Value Ref Range   Influenza A, POC Negative Negative   Influenza B, POC Negative Negative      The 10-year ASCVD risk score (Arnett DK, et al., 2019) is: 6.1%    Assessment & Plan:   Problem List Items Addressed This Visit   None Visit Diagnoses     Nonintractable headache, unspecified chronicity pattern, unspecified headache type    -  Primary   Relevant  Orders   POC COVID-19 (Completed)   POC Influenza A/B (Completed)   Cough, unspecified type       Relevant Orders   POC COVID-19 (Completed)   POC Influenza A/B (Completed)     Patient presents with 3 to 4-day history of upper respiratory symptoms.  COVID and influenza testing negative.  Suspect viral.  Nonfocal exam.  Treat symptomatically with over-the-counter medications.  She is getting some nighttime relief of cough with NyQuil.  Follow-up immediately for any fever or increased shortness of breath.  No follow-ups on file.    Carolann Littler, MD

## 2022-02-18 ENCOUNTER — Ambulatory Visit
Admission: RE | Admit: 2022-02-18 | Discharge: 2022-02-18 | Disposition: A | Discharge status: Home / Self Care | Payer: 59 | Source: Ambulatory Visit | Attending: Obstetrics and Gynecology | Admitting: Obstetrics and Gynecology

## 2022-02-18 ENCOUNTER — Ambulatory Visit
Admission: RE | Admit: 2022-02-18 | Discharge: 2022-02-18 | Disposition: A | Discharge status: Home / Self Care | Payer: 59 | Source: Ambulatory Visit | Attending: Internal Medicine | Admitting: Internal Medicine

## 2022-02-18 DIAGNOSIS — F17210 Nicotine dependence, cigarettes, uncomplicated: Secondary | ICD-10-CM | POA: Diagnosis not present

## 2022-02-18 DIAGNOSIS — Z9889 Other specified postprocedural states: Secondary | ICD-10-CM | POA: Diagnosis not present

## 2022-02-18 DIAGNOSIS — Z9189 Other specified personal risk factors, not elsewhere classified: Secondary | ICD-10-CM

## 2022-02-18 DIAGNOSIS — R922 Inconclusive mammogram: Secondary | ICD-10-CM | POA: Diagnosis not present

## 2022-02-18 DIAGNOSIS — Z122 Encounter for screening for malignant neoplasm of respiratory organs: Secondary | ICD-10-CM

## 2022-02-18 DIAGNOSIS — N6001 Solitary cyst of right breast: Secondary | ICD-10-CM | POA: Diagnosis not present

## 2022-02-18 MED ORDER — GADOPICLENOL 0.5 MMOL/ML IV SOLN
7.0000 mL | Freq: Once | INTRAVENOUS | Status: AC | PRN
  Administered 2022-02-18: 7 mL via INTRAVENOUS

## 2022-02-22 ENCOUNTER — Other Ambulatory Visit: Payer: Self-pay | Admitting: Obstetrics and Gynecology

## 2022-02-22 DIAGNOSIS — Z1231 Encounter for screening mammogram for malignant neoplasm of breast: Secondary | ICD-10-CM

## 2022-03-11 DIAGNOSIS — M1811 Unilateral primary osteoarthritis of first carpometacarpal joint, right hand: Secondary | ICD-10-CM | POA: Diagnosis not present

## 2022-03-11 DIAGNOSIS — M180 Bilateral primary osteoarthritis of first carpometacarpal joints: Secondary | ICD-10-CM | POA: Diagnosis not present

## 2022-03-11 DIAGNOSIS — M79641 Pain in right hand: Secondary | ICD-10-CM | POA: Diagnosis not present

## 2022-03-11 DIAGNOSIS — M1812 Unilateral primary osteoarthritis of first carpometacarpal joint, left hand: Secondary | ICD-10-CM | POA: Diagnosis not present

## 2022-03-11 DIAGNOSIS — M79642 Pain in left hand: Secondary | ICD-10-CM | POA: Diagnosis not present

## 2022-03-12 ENCOUNTER — Other Ambulatory Visit (HOSPITAL_COMMUNITY): Payer: Self-pay

## 2022-03-12 MED ORDER — MELOXICAM 7.5 MG PO TABS
7.5000 mg | ORAL_TABLET | Freq: Every day | ORAL | 0 refills | Status: DC
  Filled 2022-03-12 (×2): qty 30, 30d supply, fill #0

## 2022-03-15 ENCOUNTER — Other Ambulatory Visit: Payer: Self-pay

## 2022-03-16 ENCOUNTER — Other Ambulatory Visit (HOSPITAL_COMMUNITY): Payer: Self-pay

## 2022-03-31 ENCOUNTER — Ambulatory Visit: Payer: 59 | Admitting: Internal Medicine

## 2022-05-12 ENCOUNTER — Encounter: Payer: Self-pay | Admitting: Internal Medicine

## 2022-05-12 ENCOUNTER — Ambulatory Visit (INDEPENDENT_AMBULATORY_CARE_PROVIDER_SITE_OTHER): Payer: 59 | Admitting: Internal Medicine

## 2022-05-12 VITALS — BP 124/84 | HR 59 | Temp 97.9°F | Wt 151.4 lb

## 2022-05-12 DIAGNOSIS — R5383 Other fatigue: Secondary | ICD-10-CM

## 2022-05-12 DIAGNOSIS — E559 Vitamin D deficiency, unspecified: Secondary | ICD-10-CM | POA: Diagnosis not present

## 2022-05-12 DIAGNOSIS — E782 Mixed hyperlipidemia: Secondary | ICD-10-CM

## 2022-05-12 DIAGNOSIS — F439 Reaction to severe stress, unspecified: Secondary | ICD-10-CM | POA: Diagnosis not present

## 2022-05-12 LAB — COMPREHENSIVE METABOLIC PANEL
ALT: 19 U/L (ref 0–35)
AST: 19 U/L (ref 0–37)
Albumin: 4.3 g/dL (ref 3.5–5.2)
Alkaline Phosphatase: 57 U/L (ref 39–117)
BUN: 18 mg/dL (ref 6–23)
CO2: 26 mEq/L (ref 19–32)
Calcium: 9.7 mg/dL (ref 8.4–10.5)
Chloride: 105 mEq/L (ref 96–112)
Creatinine, Ser: 0.71 mg/dL (ref 0.40–1.20)
GFR: 94.91 mL/min (ref >60.00)
Glucose, Bld: 99 mg/dL (ref 70–99)
Potassium: 4.3 mEq/L (ref 3.5–5.1)
Sodium: 140 mEq/L (ref 135–145)
Total Bilirubin: 0.3 mg/dL (ref 0.2–1.2)
Total Protein: 7.1 g/dL (ref 6.0–8.3)

## 2022-05-12 LAB — CBC WITH DIFFERENTIAL/PLATELET
Basophils Absolute: 0.1 10*3/uL (ref 0.0–0.1)
Basophils Relative: 0.9 % (ref 0.0–3.0)
Eosinophils Absolute: 0.3 10*3/uL (ref 0.0–0.7)
Eosinophils Relative: 4 % (ref 0.0–5.0)
HCT: 44.5 % (ref 36.0–46.0)
Hemoglobin: 15 g/dL (ref 12.0–15.0)
Lymphocytes Relative: 34.4 % (ref 12.0–46.0)
Lymphs Abs: 2.3 10*3/uL (ref 0.7–4.0)
MCHC: 33.8 g/dL (ref 30.0–36.0)
MCV: 87.1 fl (ref 78.0–100.0)
Monocytes Absolute: 0.5 10*3/uL (ref 0.1–1.0)
Monocytes Relative: 7.1 % (ref 3.0–12.0)
Neutro Abs: 3.6 10*3/uL (ref 1.4–7.7)
Neutrophils Relative %: 53.6 % (ref 43.0–77.0)
Platelets: 310 10*3/uL (ref 150.0–400.0)
RBC: 5.11 Mil/uL (ref 3.87–5.11)
RDW: 14 % (ref 11.5–15.5)
WBC: 6.7 10*3/uL (ref 4.0–10.5)

## 2022-05-12 LAB — VITAMIN D 25 HYDROXY (VIT D DEFICIENCY, FRACTURES): VITD: 26.56 ng/mL — ABNORMAL LOW (ref 30.00–100.00)

## 2022-05-12 LAB — LIPID PANEL
Cholesterol: 228 mg/dL — ABNORMAL HIGH (ref 0–200)
HDL: 59 mg/dL (ref >39.00)
LDL Cholesterol: 146 mg/dL — ABNORMAL HIGH (ref 0–99)
NonHDL: 169.18
Total CHOL/HDL Ratio: 4
Triglycerides: 114 mg/dL (ref 0.0–149.0)
VLDL: 22.8 mg/dL (ref 0.0–40.0)

## 2022-05-12 LAB — TSH: TSH: 1.17 u[IU]/mL (ref 0.35–5.50)

## 2022-05-12 LAB — VITAMIN B12: Vitamin B-12: 278 pg/mL (ref 211–911)

## 2022-05-12 NOTE — Progress Notes (Signed)
Established Patient Office Visit     CC/Reason for Visit: Follow-up chronic conditions, discuss acute concerns  HPI: Katelyn Hodges is a 57 y.o. female who is coming in today for the above mentioned reasons. Past Medical History is significant for: Hyperlipidemia and depression/anxiety.  She has been having a lot of stress at work.  Also requesting labs.  She decreased Lexapro from 10 to 5 mg.   Past Medical/Surgical History: Past Medical History:  Diagnosis Date   Anxiety    Atypical mole    Breast mass, right    Genital warts    GERD (gastroesophageal reflux disease)    Glaucoma    Headache(784.0)    Smoker    Ulcer     Past Surgical History:  Procedure Laterality Date   BREAST LUMPECTOMY  1998   BREAST LUMPECTOMY WITH RADIOACTIVE SEED LOCALIZATION Right 03/14/2019   Procedure: RIGHT BREAST LUMPECTOMY WITH RADIOACTIVE SEED LOCALIZATION;  Surgeon: Harriette Bouillon, MD;  Location: [facility];  Service: General;  Laterality: Right;   DIAGNOSTIC LAPAROSCOPY     TONSILLECTOMY      Social History:  reports that she has been smoking cigarettes. She has a 30.00 pack-year smoking history. She has never used smokeless tobacco. She reports current alcohol use of about 12.0 standard drinks of alcohol per week. She reports that she does not use drugs.  Allergies: Allergies  Allergen Reactions   Bee Venom Anaphylaxis   Neomy-Bacit-Polymyx-Pramoxine    Bactroban [Mupirocin] Rash   Keflex [Cephalexin] Rash   Neomycin Rash    Family History:  Family History  Problem Relation Age of Onset   Breast cancer Mother 66   Cancer Mother        breast   Heart disease Father    Hyperlipidemia Father    Nephritis Sister    Heart disease Maternal Uncle    Cancer Maternal Uncle    Hyperlipidemia Maternal Uncle    Hypertension Maternal Uncle    Heart disease Paternal Uncle    Diabetes Maternal Grandmother    Heart disease Maternal Grandfather    Cancer  Maternal Grandfather    Hyperlipidemia Maternal Grandfather    Hypertension Maternal Grandfather    Throat cancer Maternal Grandfather    Lung cancer Maternal Grandfather    Heart disease Paternal Grandfather    Cancer Paternal Grandfather    Colon cancer Neg Hx    Colon polyps Neg Hx    Stomach cancer Neg Hx    Rectal cancer Neg Hx      Current Outpatient Medications:    escitalopram (LEXAPRO) 10 MG tablet, Take 1 tablet (10 mg total) by mouth daily., Disp: 90 tablet, Rfl: 1   meloxicam (MOBIC) 7.5 MG tablet, Take 1 tablet (7.5 mg total) by mouth daily., Disp: 30 tablet, Rfl: 0  Review of Systems:  Negative unless indicated in HPI.   Physical Exam: Vitals:   05/12/22 0703  BP: 124/84  Pulse: (!) 59  Temp: 97.9 F (36.6 C)  TempSrc: Oral  SpO2: 100%  Weight: 151 lb 6.4 oz (68.7 kg)    Body mass index is 23.36 kg/m.   Physical Exam Vitals reviewed.  Constitutional:      Appearance: Normal appearance.  HENT:     Head: Normocephalic and atraumatic.  Eyes:     Conjunctiva/sclera: Conjunctivae normal.     Pupils: Pupils are equal, round, and reactive to light.  Cardiovascular:     Rate and Rhythm: Normal rate and  regular rhythm.  Pulmonary:     Effort: Pulmonary effort is normal.     Breath sounds: Normal breath sounds.  Skin:    General: Skin is warm and dry.  Neurological:     General: No focal deficit present.     Mental Status: She is alert and oriented to person, place, and time.  Psychiatric:        Mood and Affect: Mood normal.        Behavior: Behavior normal.        Thought Content: Thought content normal.        Judgment: Judgment normal.      Impression and Plan:  Stress  chronic fatigue - Plan: TSH, Vitamin B12, Vitamin B12, TSH  Vitamin D deficiency - Plan: VITAMIN D 25 Hydroxy (Vit-D Deficiency, Fractures), VITAMIN D 25 Hydroxy (Vit-D Deficiency, Fractures)  Mixed hyperlipidemia - Plan: CBC with Differential/Platelet, Comprehensive  metabolic panel, Lipid panel, Lipid panel, Comprehensive metabolic panel, CBC with Differential/Platelet  -Check labs today to follow-up chronic conditions, because of chronic fatigue will add TSH and B12. -I have strongly encouraged her to look into CBT.  Time spent:31 minutes reviewing chart, interviewing and examining patient and formulating plan of care.     Chaya Jan, MD [facility]

## 2022-05-13 ENCOUNTER — Ambulatory Visit: Payer: 59 | Admitting: Family Medicine

## 2022-05-13 NOTE — Progress Notes (Deleted)
Rubin Payor, PhD, LAT, ATC acting as a scribe for [name], MD.  Subjective:    CC: R ankle pain  HPI: Pt is a 57 y/o female c/o R ankle pain. Pt suffered a fx to the distal end of her R fibula after falling down the stairs. She was was seen at [facility] on 05/24/22 and then her care was managed by Dr. Victorino Dike at [facility]. Today, pt reports ***  Dx imaging: 05/24/22 R foot & ankle XR  Pertinent review of Systems: ***  Relevant historical information: ***   Objective:   There were no vitals filed for this visit. General: Well Developed, well nourished, and in no acute distress.   MSK: ***  Lab and Radiology Results Results for orders placed or performed in visit on 05/12/22 (from the past 72 hour(s))  VITAMIN D 25 Hydroxy (Vit-D Deficiency, Fractures)     Status: Abnormal   Collection Time: 05/12/22  7:33 AM  Result Value Ref Range   VITD 26.56 (L) 30.00 - 100.00 ng/mL  Vitamin B12     Status: None   Collection Time: 05/12/22  7:33 AM  Result Value Ref Range   Vitamin B-12 278 211 - 911 pg/mL  TSH     Status: None   Collection Time: 05/12/22  7:33 AM  Result Value Ref Range   TSH 1.17 0.35 - 5.50 uIU/mL  Lipid panel     Status: Abnormal   Collection Time: 05/12/22  7:33 AM  Result Value Ref Range   Cholesterol 228 (H) 0 - 200 mg/dL    Comment: ATP III Classification       Desirable:  < 200 mg/dL               Borderline High:  200 - 239 mg/dL          High:  > = 161 mg/dL   Triglycerides 096.0 0.0 - 149.0 mg/dL    Comment: Normal:  <454 mg/dLBorderline High:  150 - 199 mg/dL   HDL 09.81 >19.14 mg/dL   VLDL 78.2 0.0 - 95.6 mg/dL   LDL Cholesterol 213 (H) 0 - 99 mg/dL   Total CHOL/HDL Ratio 4     Comment:                Men          Women1/2 Average Risk     3.4          3.3Average Risk          5.0          4.42X Average Risk          9.6          7.13X Average Risk          15.0          11.0                       NonHDL 169.18     Comment: NOTE:   Non-HDL goal should be 30 mg/dL higher than patient's LDL goal (i.e. LDL goal of < 70 mg/dL, would have non-HDL goal of < 100 mg/dL)  Comprehensive metabolic panel     Status: None   Collection Time: 05/12/22  7:33 AM  Result Value Ref Range   Sodium 140 135 - 145 mEq/L   Potassium 4.3 3.5 - 5.1 mEq/L   Chloride 105 96 - 112 mEq/L   CO2 26  19 - 32 mEq/L   Glucose, Bld 99 70 - 99 mg/dL   BUN 18 6 - 23 mg/dL   Creatinine, Ser 1.61 0.40 - 1.20 mg/dL   Total Bilirubin 0.3 0.2 - 1.2 mg/dL   Alkaline Phosphatase 57 39 - 117 U/L   AST 19 0 - 37 U/L   ALT 19 0 - 35 U/L   Total Protein 7.1 6.0 - 8.3 g/dL   Albumin 4.3 3.5 - 5.2 g/dL   GFR 09.60 >45.40 mL/min    Comment: Calculated using the CKD-EPI Creatinine Equation (2021)   Calcium 9.7 8.4 - 10.5 mg/dL  CBC with Differential/Platelet     Status: None   Collection Time: 05/12/22  7:33 AM  Result Value Ref Range   WBC 6.7 4.0 - 10.5 K/uL   RBC 5.11 3.87 - 5.11 Mil/uL   Hemoglobin 15.0 12.0 - 15.0 g/dL   HCT 98.1 19.1 - 47.8 %   MCV 87.1 78.0 - 100.0 fl   MCHC 33.8 30.0 - 36.0 g/dL   RDW 29.5 62.1 - 30.8 %   Platelets 310.0 150.0 - 400.0 K/uL   Neutrophils Relative % 53.6 43.0 - 77.0 %   Lymphocytes Relative 34.4 12.0 - 46.0 %   Monocytes Relative 7.1 3.0 - 12.0 %   Eosinophils Relative 4.0 0.0 - 5.0 %   Basophils Relative 0.9 0.0 - 3.0 %   Neutro Abs 3.6 1.4 - 7.7 K/uL   Lymphs Abs 2.3 0.7 - 4.0 K/uL   Monocytes Absolute 0.5 0.1 - 1.0 K/uL   Eosinophils Absolute 0.3 0.0 - 0.7 K/uL   Basophils Absolute 0.1 0.0 - 0.1 K/uL   No results found.    Impression and Recommendations:    Assessment and Plan: 57 y.o. female with ***.  PDMP not reviewed this encounter. No orders of the defined types were placed in this encounter.  No orders of the defined types were placed in this encounter.   Discussed warning signs or symptoms. Please see discharge instructions. Patient expresses understanding.   ***

## 2022-05-16 ENCOUNTER — Other Ambulatory Visit (HOSPITAL_BASED_OUTPATIENT_CLINIC_OR_DEPARTMENT_OTHER): Payer: Self-pay

## 2022-05-16 ENCOUNTER — Other Ambulatory Visit: Payer: Self-pay | Admitting: Internal Medicine

## 2022-05-16 DIAGNOSIS — E559 Vitamin D deficiency, unspecified: Secondary | ICD-10-CM

## 2022-05-16 DIAGNOSIS — E782 Mixed hyperlipidemia: Secondary | ICD-10-CM

## 2022-05-16 MED ORDER — VITAMIN D (ERGOCALCIFEROL) 1.25 MG (50000 UNIT) PO CAPS
50000.0000 [IU] | ORAL_CAPSULE | ORAL | 0 refills | Status: AC
Start: 2022-05-16 — End: 2022-08-22
  Filled 2022-05-16 – 2022-05-30 (×2): qty 12, 84d supply, fill #0

## 2022-05-20 ENCOUNTER — Ambulatory Visit (INDEPENDENT_AMBULATORY_CARE_PROVIDER_SITE_OTHER): Payer: 59

## 2022-05-20 ENCOUNTER — Ambulatory Visit: Payer: 59 | Admitting: Family Medicine

## 2022-05-20 ENCOUNTER — Encounter: Payer: Self-pay | Admitting: Family Medicine

## 2022-05-20 VITALS — BP 126/80 | HR 83 | Ht 67.5 in | Wt 149.8 lb

## 2022-05-20 DIAGNOSIS — M79671 Pain in right foot: Secondary | ICD-10-CM

## 2022-05-20 DIAGNOSIS — M7731 Calcaneal spur, right foot: Secondary | ICD-10-CM | POA: Diagnosis not present

## 2022-05-20 NOTE — Progress Notes (Signed)
Katelyn Payor, PhD, LAT, ATC acting as a scribe for [name], MD.  Subjective:    CC: R ankle pain  HPI: Pt is a 57 y/o female c/o R ankle pain. Pt suffered a fx to the distal end of her R fibula after falling down the stairs. She was was seen at [facility] on 05/23/21 and then her care was managed by Dr. Victorino Dike at [facility]. Today, pt reports continued pain 1 year s/p fracture. Swelling present after working. Pain at lateral aspect of the foot. Pain with first morning steps.  She never did have physical therapy after her injury a year ago.  She was able to go back to work he did pretty well with basic conservative management strategies.  Dx imaging: 05/24/22 R foot & ankle XR  Pertinent review of Systems: No fevers or chills  Relevant historical information: Hyperlipidemia.   Objective:    Vitals:   05/20/22 1118  BP: 126/80  Pulse: 83  SpO2: 97%   General: Well Developed, well nourished, and in no acute distress.   MSK: Right foot and ankle: Mild swelling lateral ankle otherwise normal. Normal foot and ankle motion. Stable ligamentous exam. Intact strength.  Some pain with resisted foot eversion present. Pulses cap refill and sensation are intact distally. Mildly tender palpation at lateral malleolus area.  Lab and Radiology Results  X-ray images right foot and ankle obtained today personally and independently interpreted. No acute fractures are visible. No large persistent avulsion fractures are visible. Accessory ossicle or perhaps old avulsion fracture is present at lateral to the cuboid area. Degenerative changes present in the lateral ankle and midfoot. Await formal radiology review    Impression and Recommendations:    Assessment and Plan: 57 y.o. female with right persistent foot and ankle pain after an ankle inversion injury resulting in an avulsion fracture occurring about a year ago.  Symptoms have improved since her original injury but she  still having persistent pain swelling and dysfunction.  She is a good candidate for trial of conservative management.  Plan for trial of physical therapy.  Also recommend compression sleeve.  Of note she is leaving for a vacation in [place] starting about 2 weeks from now.  She will be gone for about 3 weeks after that.  Recommend physical therapy to start after you get back from [place] which will be in June.  Check back with me 6 to 8 weeks after she starts physical therapy which would be late July or August..  PDMP not reviewed this encounter. Orders Placed This Encounter  Procedures   DG Ankle Complete Right    Standing Status:   Future    Number of Occurrences:   1    Standing Expiration Date:   05/20/2023    Order Specific Question:   Reason for Exam (SYMPTOM  OR DIAGNOSIS REQUIRED)    Answer:   right ankle/foot pain    Order Specific Question:   Preferred imaging location?    Answer:   [facility]    Order Specific Question:   Is patient pregnant?    Answer:   No   DG Foot Complete Right    Standing Status:   Future    Number of Occurrences:   1    Standing Expiration Date:   05/20/2023    Order Specific Question:   Reason for Exam (SYMPTOM  OR DIAGNOSIS REQUIRED)    Answer:   right foot/ankle pain  Order Specific Question:   Preferred imaging location?    Answer:   [facility]    Order Specific Question:   Is patient pregnant?    Answer:   No   Ambulatory referral to Physical Therapy    Referral Priority:   Routine    Referral Type:   Physical Medicine    Referral Reason:   Specialty Services Required    Requested Specialty:   Physical Therapy    Number of Visits Requested:   1   No orders of the defined types were placed in this encounter.   Discussed warning signs or symptoms. Please see discharge instructions. Patient expresses understanding.   The above documentation has been reviewed and is accurate and complete [name], M.D.

## 2022-05-20 NOTE — Patient Instructions (Addendum)
Thank you for coming in today.   Please use Voltaren gel (Generic Diclofenac Gel) up to 4x daily for pain as needed.  This is available over-the-counter as both the name brand Voltaren gel and the generic diclofenac gel.   I recommend you obtained a compression sleeve to help with your joint problems. There are many options on the market however I recommend obtaining a full ankle Body Helix compression sleeve.  You can find information (including how to appropriate measure yourself for sizing) can be found at www.Body [employer].ch.  Many of these products are health savings account (HSA) eligible.  You can use the compression sleeve at any time throughout the day but is most important to use while being active as well as for 2 hours post-activity.   It is appropriate to ice following activity with the compression sleeve in place.   I've referred you to Physical Therapy.  Let us know if you don't hear from them in one week.   Please complete the exercises that the athletic trainer went over with you:  View at www.my-exercise-code.com using code: 1OXW96E

## 2022-05-23 ENCOUNTER — Other Ambulatory Visit (HOSPITAL_BASED_OUTPATIENT_CLINIC_OR_DEPARTMENT_OTHER): Payer: Self-pay

## 2022-05-25 NOTE — Progress Notes (Signed)
Right foot x-ray shows no fractures.  The area where you are experiencing pain looks okay to radiology.

## 2022-05-25 NOTE — Progress Notes (Signed)
Right ankle x-ray shows some arthritis changes.  You have had some healing since the prior injury in your ankle.

## 2022-05-30 ENCOUNTER — Other Ambulatory Visit (HOSPITAL_BASED_OUTPATIENT_CLINIC_OR_DEPARTMENT_OTHER): Payer: Self-pay

## 2022-08-26 ENCOUNTER — Ambulatory Visit: Payer: 59 | Admitting: Radiology

## 2022-08-26 ENCOUNTER — Ambulatory Visit: Payer: 59

## 2022-08-31 ENCOUNTER — Encounter: Payer: Self-pay | Admitting: Internal Medicine

## 2022-08-31 DIAGNOSIS — S0300XA Dislocation of jaw, unspecified side, initial encounter: Secondary | ICD-10-CM

## 2022-09-09 ENCOUNTER — Other Ambulatory Visit: Payer: Self-pay | Admitting: Internal Medicine

## 2022-09-09 ENCOUNTER — Ambulatory Visit
Admission: RE | Admit: 2022-09-09 | Discharge: 2022-09-09 | Disposition: A | Discharge status: Home / Self Care | Payer: 59 | Source: Ambulatory Visit | Attending: Obstetrics and Gynecology | Admitting: Obstetrics and Gynecology

## 2022-09-09 DIAGNOSIS — Z1231 Encounter for screening mammogram for malignant neoplasm of breast: Secondary | ICD-10-CM

## 2022-09-15 ENCOUNTER — Encounter: Payer: Self-pay | Admitting: *Deleted

## 2022-09-23 DIAGNOSIS — H5213 Myopia, bilateral: Secondary | ICD-10-CM | POA: Diagnosis not present

## 2022-09-29 NOTE — Telephone Encounter (Signed)
Left message on machine for patient to see if she wanted a different referral to neurology.

## 2022-10-04 ENCOUNTER — Ambulatory Visit: Payer: 59 | Admitting: Internal Medicine

## 2022-10-21 ENCOUNTER — Ambulatory Visit: Payer: 59

## 2022-11-04 ENCOUNTER — Encounter: Payer: 59 | Admitting: Radiology

## 2022-12-30 ENCOUNTER — Other Ambulatory Visit (HOSPITAL_COMMUNITY)
Admission: RE | Admit: 2022-12-30 | Discharge: 2022-12-30 | Disposition: A | Discharge status: Home / Self Care | Payer: 59 | Source: Ambulatory Visit | Attending: Radiology | Admitting: Radiology

## 2022-12-30 ENCOUNTER — Other Ambulatory Visit: Payer: Self-pay

## 2022-12-30 ENCOUNTER — Other Ambulatory Visit (HOSPITAL_COMMUNITY): Payer: Self-pay

## 2022-12-30 ENCOUNTER — Ambulatory Visit (INDEPENDENT_AMBULATORY_CARE_PROVIDER_SITE_OTHER): Payer: 59 | Admitting: Radiology

## 2022-12-30 ENCOUNTER — Encounter: Payer: Self-pay | Admitting: Radiology

## 2022-12-30 VITALS — BP 110/72 | HR 75 | Ht 67.5 in | Wt 150.0 lb

## 2022-12-30 DIAGNOSIS — N6091 Unspecified benign mammary dysplasia of right breast: Secondary | ICD-10-CM | POA: Diagnosis not present

## 2022-12-30 DIAGNOSIS — Z9189 Other specified personal risk factors, not elsewhere classified: Secondary | ICD-10-CM | POA: Diagnosis not present

## 2022-12-30 DIAGNOSIS — N951 Menopausal and female climacteric states: Secondary | ICD-10-CM | POA: Diagnosis not present

## 2022-12-30 DIAGNOSIS — Z113 Encounter for screening for infections with a predominantly sexual mode of transmission: Secondary | ICD-10-CM | POA: Insufficient documentation

## 2022-12-30 DIAGNOSIS — Z01419 Encounter for gynecological examination (general) (routine) without abnormal findings: Secondary | ICD-10-CM

## 2022-12-30 DIAGNOSIS — N958 Other specified menopausal and perimenopausal disorders: Secondary | ICD-10-CM

## 2022-12-30 DIAGNOSIS — R350 Frequency of micturition: Secondary | ICD-10-CM

## 2022-12-30 DIAGNOSIS — Z833 Family history of diabetes mellitus: Secondary | ICD-10-CM

## 2022-12-30 DIAGNOSIS — F17200 Nicotine dependence, unspecified, uncomplicated: Secondary | ICD-10-CM

## 2022-12-30 MED ORDER — VEOZAH 45 MG PO TABS
1.0000 | ORAL_TABLET | Freq: Every day | ORAL | 2 refills | Status: DC
Start: 2022-12-30 — End: 2023-03-23
  Filled 2022-12-30: qty 30, 30d supply, fill #0

## 2022-12-30 MED ORDER — ESTRADIOL 0.1 MG/GM VA CREA
1.0000 g | TOPICAL_CREAM | VAGINAL | 12 refills | Status: DC
Start: 2022-12-30 — End: 2023-03-23
  Filled 2022-12-30: qty 42.5, 90d supply, fill #0

## 2022-12-30 NOTE — Progress Notes (Signed)
   Katelyn Hodges [facility] [birth year] [id number]   History: Postmenopausal 57 y.o. presents for annual exam. Would like fasting labs today, does have a PCP. C/o hot flashes and night sweats. Hx of ductal hyperplasia s/p lumpectomy, high risk for breast cancer. Urinary frequency, no dysuria or leakage.    Gynecologic History Postmenopausal Last Pap: 2021. Results were: normal Last mammogram: 8/24. Results were: normal Last colonoscopy: 02/2021   Obstetric History OB History  Gravida Para Term Preterm AB Living  0 0 0 0 0 0  SAB IAB Ectopic Multiple Live Births  0 0 0 0 0     The following portions of the patient's history were reviewed and updated as appropriate: allergies, current medications, past family history, past medical history, past social history, past surgical history, and problem list.  Review of Systems Pertinent items noted in HPI and remainder of comprehensive ROS otherwise negative.  Past medical history, past surgical history, family history and social history were all reviewed and documented in the EPIC chart.  Exam:  Vitals:   12/30/22 0940  BP: 110/72  Pulse: 75  SpO2: 98%  Weight: 150 lb (68 kg)  Height: 5' 7.5" (1.715 m)   Body mass index is 23.15 kg/m.  General appearance:  Normal Thyroid:  Symmetrical, normal in size, without palpable masses or nodularity. Respiratory  Auscultation:  Clear without wheezing or rhonchi Cardiovascular  Auscultation:  Regular rate, without rubs, murmurs or gallops  Edema/varicosities:  Not grossly evident Abdominal  Soft,nontender, without masses, guarding or rebound.  Liver/spleen:  No organomegaly noted  Hernia:  None appreciated  Skin  Inspection:  Grossly normal Breasts: Examined lying and sitting.   Right: Without masses, retractions, nipple discharge or axillary adenopathy.   Left: Without masses, retractions, nipple discharge or axillary adenopathy. Genitourinary   Inguinal/mons:  Normal without inguinal  adenopathy  External genitalia:  Normal appearing vulva with no masses, tenderness, or lesions  BUS/Urethra/Skene's glands:  Normal  Vagina:  Normal appearing with normal color and discharge, no lesions. Atrophy: moderate   Cervix:  Normal appearing without discharge or lesions  Uterus:  Normal in size, shape and contour.  Midline and mobile, nontender  Adnexa/parametria:     Rt: Normal in size, without masses or tenderness.   Lt: Normal in size, without masses or tenderness.  Anus and perineum: Normal    Raynelle Fanning, CMA present for exam  Assessment/Plan:   1. Well woman exam with routine gynecological exam (Primary) - Lipid Profile - Cytology - PAP( [place])  2. Screening for STDs (sexually transmitted diseases) - HIV antibody (with reflex) - RPR - Hepatitis C antibody - Cytology - PAP( [place])  3. Smoker - Lipid Profile - Lipid Profile  4. Family history of diabetes mellitus - Comp Met (CMET) - CBC - HgB A1c  5. Urinary frequency Will send urine culture Begin Kegels Will begin vaginal estrogen - Urinalysis,Complete w/RFL Culture  6. Atypical ductal hyperplasia of right breast  7. At high risk for breast cancer MRI yearly  8. Menopausal vasomotor syndrome - Fezolinetant (VEOZAH) 45 MG TABS; Take 1 tablet (45 mg total) by mouth daily.  Dispense: 30 tablet; Refill: 2  9. Genitourinary syndrome of menopause - estradiol (ESTRACE VAGINAL) 0.1 MG/GM vaginal cream; Place 1 g vaginally 3 (three) times a week.  Dispense: 42.5 g; Refill: 12     Kayzlee Wirtanen B WHNP-BC, 10:07 AM 12/30/2022

## 2022-12-31 LAB — COMPREHENSIVE METABOLIC PANEL
AG Ratio: 2 (calc) (ref 1.0–2.5)
ALT: 18 U/L (ref 6–29)
AST: 17 U/L (ref 10–35)
Albumin: 4.8 g/dL (ref 3.6–5.1)
Alkaline phosphatase (APISO): 70 U/L (ref 37–153)
BUN: 14 mg/dL (ref 7–25)
CO2: 27 mmol/L (ref 20–32)
Calcium: 10.4 mg/dL (ref 8.6–10.4)
Chloride: 103 mmol/L (ref 98–110)
Creat: 0.66 mg/dL (ref 0.50–1.03)
Globulin: 2.4 g/dL (ref 1.9–3.7)
Glucose, Bld: 88 mg/dL (ref 65–99)
Potassium: 4.2 mmol/L (ref 3.5–5.3)
Sodium: 138 mmol/L (ref 135–146)
Total Bilirubin: 0.7 mg/dL (ref 0.2–1.2)
Total Protein: 7.2 g/dL (ref 6.1–8.1)

## 2022-12-31 LAB — CBC
HCT: 46 % — ABNORMAL HIGH (ref 35.0–45.0)
Hemoglobin: 15.5 g/dL (ref 11.7–15.5)
MCH: 29.6 pg (ref 27.0–33.0)
MCHC: 33.7 g/dL (ref 32.0–36.0)
MCV: 87.8 fL (ref 80.0–100.0)
MPV: 10.9 fL (ref 7.5–12.5)
Platelets: 345 10*3/uL (ref 140–400)
RBC: 5.24 10*6/uL — ABNORMAL HIGH (ref 3.80–5.10)
RDW: 12.8 % (ref 11.0–15.0)
WBC: 7.7 10*3/uL (ref 3.8–10.8)

## 2022-12-31 LAB — HEMOGLOBIN A1C
Hgb A1c MFr Bld: 5.4 %{Hb} (ref <5.7)
Mean Plasma Glucose: 108 mg/dL
eAG (mmol/L): 6 mmol/L

## 2022-12-31 LAB — LIPID PANEL
Cholesterol: 258 mg/dL — ABNORMAL HIGH (ref <200)
HDL: 64 mg/dL (ref >=50)
LDL Cholesterol (Calc): 164 mg/dL — ABNORMAL HIGH
Non-HDL Cholesterol (Calc): 194 mg/dL — ABNORMAL HIGH (ref <130)
Total CHOL/HDL Ratio: 4 (calc) (ref <5.0)
Triglycerides: 154 mg/dL — ABNORMAL HIGH (ref <150)

## 2022-12-31 LAB — HIV ANTIBODY (ROUTINE TESTING W REFLEX): HIV 1&2 Ab, 4th Generation: NONREACTIVE

## 2022-12-31 LAB — RPR: RPR Ser Ql: NONREACTIVE

## 2022-12-31 LAB — HEPATITIS C ANTIBODY: Hepatitis C Ab: NONREACTIVE

## 2023-01-01 LAB — URINALYSIS, COMPLETE W/RFL CULTURE
Bacteria, UA: NONE SEEN /[HPF]
Bilirubin Urine: NEGATIVE
Glucose, UA: NEGATIVE
Hyaline Cast: NONE SEEN /LPF
Ketones, ur: NEGATIVE
Leukocyte Esterase: NEGATIVE
Nitrites, Initial: NEGATIVE
Protein, ur: NEGATIVE
Specific Gravity, Urine: 1.004 (ref 1.001–1.035)
pH: 5.5 (ref 5.0–8.0)

## 2023-01-01 LAB — URINE CULTURE
MICRO NUMBER:: 15848141
Result:: NO GROWTH
SPECIMEN QUALITY:: ADEQUATE

## 2023-01-01 LAB — CULTURE INDICATED

## 2023-01-03 ENCOUNTER — Other Ambulatory Visit (HOSPITAL_COMMUNITY): Payer: Self-pay

## 2023-01-03 LAB — CYTOLOGY - PAP
Chlamydia: NEGATIVE
Comment: NEGATIVE
Comment: NEGATIVE
Comment: NEGATIVE
Comment: NEGATIVE
Comment: NEGATIVE
Comment: NORMAL
Diagnosis: UNDETERMINED — AB
HPV 16: POSITIVE — AB
HPV 18 / 45: NEGATIVE
High risk HPV: POSITIVE — AB
Neisseria Gonorrhea: NEGATIVE
Trichomonas: NEGATIVE

## 2023-01-04 ENCOUNTER — Other Ambulatory Visit: Payer: Self-pay

## 2023-01-04 DIAGNOSIS — R87610 Atypical squamous cells of undetermined significance on cytologic smear of cervix (ASC-US): Secondary | ICD-10-CM

## 2023-01-06 ENCOUNTER — Encounter: Payer: Self-pay | Admitting: Obstetrics and Gynecology

## 2023-01-06 ENCOUNTER — Ambulatory Visit (INDEPENDENT_AMBULATORY_CARE_PROVIDER_SITE_OTHER): Payer: 59 | Admitting: Obstetrics and Gynecology

## 2023-01-06 ENCOUNTER — Other Ambulatory Visit (HOSPITAL_COMMUNITY)
Admission: RE | Admit: 2023-01-06 | Discharge: 2023-01-06 | Disposition: A | Discharge status: Home / Self Care | Payer: 59 | Source: Ambulatory Visit | Attending: Obstetrics and Gynecology | Admitting: Obstetrics and Gynecology

## 2023-01-06 VITALS — BP 124/78 | HR 93 | Wt 150.0 lb

## 2023-01-06 DIAGNOSIS — R87810 Cervical high risk human papillomavirus (HPV) DNA test positive: Secondary | ICD-10-CM

## 2023-01-06 DIAGNOSIS — N72 Inflammatory disease of cervix uteri: Secondary | ICD-10-CM

## 2023-01-06 DIAGNOSIS — R87610 Atypical squamous cells of undetermined significance on cytologic smear of cervix (ASC-US): Secondary | ICD-10-CM | POA: Diagnosis not present

## 2023-01-06 DIAGNOSIS — N841 Polyp of cervix uteri: Secondary | ICD-10-CM | POA: Diagnosis not present

## 2023-01-06 DIAGNOSIS — N870 Mild cervical dysplasia: Secondary | ICD-10-CM | POA: Diagnosis not present

## 2023-01-06 NOTE — Assessment & Plan Note (Signed)
Abnormal PAP results reviewed. ASCCP guidelines reviewed. Consents signed. Unsatisfatory colposcopy, ECC and polypectomy performed. Aftercare instructions provided.  Will call with results.

## 2023-01-06 NOTE — Patient Instructions (Signed)
It is common to have vaginal bleeding and cramping for up to 72 hours after your biopsy. Please call our office with heavy vaginal bleeding, severe abdominal pain or fever. Avoid intercourse, tampon use, douching and baths for 7 days to decrease the risk of infection.

## 2023-01-06 NOTE — Progress Notes (Signed)
57 y.o. G0P0000 female with ASCUS, HPV 16, ADH of right breast, VMS  here for colposcopy.  Patient reports a history of cervical warts treated in early adulthood with laser. She reports normal PAP since. Traveling to [place] (home) for the holidays.  Patient's last menstrual period was 07/06/2018.    Last PAP:    Component Value Date/Time   DIAGPAP (A) 12/30/2022 1006    - Atypical squamous cells of undetermined significance (ASC-US)   HPVHIGH Positive (A) 12/30/2022 1006   ADEQPAP  12/30/2022 1006    Satisfactory but limited for evaluation with partially obscuring   ADEQPAP organisms; transformation zone component present. 12/30/2022 1006   GYN HISTORY: History of cervical warts treated in early adulthood with laser ADH of right breast,  OB History  Gravida Para Term Preterm AB Living  0 0 0 0 0 0  SAB IAB Ectopic Multiple Live Births  0 0 0 0 0    Past Medical History:  Diagnosis Date   Anxiety    Atypical mole    Breast mass, right    Genital warts    GERD (gastroesophageal reflux disease)    Glaucoma    Headache(784.0)    Smoker    Ulcer     Past Surgical History:  Procedure Laterality Date   BREAST LUMPECTOMY  1998   BREAST LUMPECTOMY WITH RADIOACTIVE SEED LOCALIZATION Right 03/14/2019   Procedure: RIGHT BREAST LUMPECTOMY WITH RADIOACTIVE SEED LOCALIZATION;  Surgeon: Harriette Bouillon, MD;  Location: [facility];  Service: General;  Laterality: Right;   DIAGNOSTIC LAPAROSCOPY     LASER ABLATION CONDYLOMA CERVICAL / VULVAR     TONSILLECTOMY      Current Outpatient Medications on File Prior to Visit  Medication Sig Dispense Refill   estradiol (ESTRACE VAGINAL) 0.1 MG/GM vaginal cream Place 1 g vaginally 3 (three) times a week. 42.5 g 12   Fezolinetant (VEOZAH) 45 MG TABS Take 1 tablet (45 mg total) by mouth daily. 30 tablet 2   No current facility-administered medications on file prior to visit.    Allergies  Allergen Reactions   Bee Venom  Anaphylaxis   Neomy-Bacit-Polymyx-Pramoxine    Bactroban [Mupirocin] Rash   Keflex [Cephalexin] Rash   Neomycin Rash      PE Today's Vitals   01/06/23 0814  BP: 124/78  Pulse: 93  SpO2: 99%  Weight: 150 lb (68 kg)   Body mass index is 23.15 kg/m.  Physical Exam Vitals reviewed. Exam conducted with a chaperone present.  Constitutional:      General: She is not in acute distress.    Appearance: Normal appearance.  HENT:     Head: Normocephalic and atraumatic.     Nose: Nose normal.  Eyes:     Extraocular Movements: Extraocular movements intact.     Conjunctiva/sclera: Conjunctivae normal.  Pulmonary:     Effort: Pulmonary effort is normal.  Genitourinary:    General: Normal vulva.     Exam position: Lithotomy position.     Vagina: Normal. No vaginal discharge.     Cervix: Normal. No cervical motion tenderness, discharge or lesion.     Uterus: Normal. Not enlarged and not tender.      Adnexa: Right adnexa normal and left adnexa normal.        Comments: Cervical polyp at 5:00 Musculoskeletal:        General: Normal range of motion.     Cervical back: Normal range of motion.  Neurological:  General: No focal deficit present.     Mental Status: She is alert.  Psychiatric:        Mood and Affect: Mood normal.        Behavior: Behavior normal.      Colposcopy Procedure Consented for procedure.  Speculum placed in vagina.  Acetic acid 3% was applied to cervix.  Unsatisfactory colposcopy.  Biopsies taken Yes.   Location(s) - 5:00, polypectomy also performed, sent as 1 specimen.  ECC performed  Specimens to pathology separately.  Monsel's applied to biopsy areas.   Good hemostasis.  Minimal EBL. No complications.  Tolerated well.     Assessment and Plan:        ASCUS with positive high risk HPV cervical Assessment & Plan: Abnormal PAP results reviewed. ASCCP guidelines reviewed. Consents signed. Unsatisfatory colposcopy, ECC and polypectomy  performed. Aftercare instructions provided.  Will call with results.  Orders: -     Surgical pathology -     Surgical pathology    Rosalyn Gess, MD

## 2023-01-09 LAB — SURGICAL PATHOLOGY

## 2023-01-12 ENCOUNTER — Other Ambulatory Visit (HOSPITAL_BASED_OUTPATIENT_CLINIC_OR_DEPARTMENT_OTHER): Payer: Self-pay

## 2023-01-12 DIAGNOSIS — N72 Inflammatory disease of cervix uteri: Secondary | ICD-10-CM

## 2023-01-12 MED ORDER — DOXYCYCLINE MONOHYDRATE 100 MG PO CAPS
100.0000 mg | ORAL_CAPSULE | Freq: Two times a day (BID) | ORAL | 0 refills | Status: AC
Start: 2023-01-12 — End: 2023-01-22

## 2023-01-12 MED ORDER — DOXYCYCLINE MONOHYDRATE 100 MG PO CAPS
100.0000 mg | ORAL_CAPSULE | Freq: Two times a day (BID) | ORAL | 0 refills | Status: DC
Start: 2023-01-12 — End: 2023-01-12
  Filled 2023-01-12: qty 20, 10d supply, fill #0

## 2023-01-12 NOTE — Addendum Note (Signed)
Addended by: Rosalyn Gess on: 01/12/2023 08:33 AM   Modules accepted: Orders

## 2023-01-12 NOTE — Telephone Encounter (Signed)
FYI. Rx resent already for you to preferred pharmacy and will notify the pt via mychart.

## 2023-01-12 NOTE — Telephone Encounter (Signed)
Pt requesting [facility]xycycl[facility] [place]. She is out of town   [facility]: [address] [place], [place] [address]

## 2023-01-13 ENCOUNTER — Other Ambulatory Visit (HOSPITAL_BASED_OUTPATIENT_CLINIC_OR_DEPARTMENT_OTHER): Payer: Self-pay

## 2023-03-23 ENCOUNTER — Encounter: Payer: Self-pay | Admitting: Internal Medicine

## 2023-03-23 ENCOUNTER — Ambulatory Visit: Admitting: Internal Medicine

## 2023-03-23 ENCOUNTER — Other Ambulatory Visit (HOSPITAL_BASED_OUTPATIENT_CLINIC_OR_DEPARTMENT_OTHER): Payer: Self-pay

## 2023-03-23 VITALS — BP 120/80 | HR 94 | Temp 98.2°F | Wt 151.1 lb

## 2023-03-23 DIAGNOSIS — F411 Generalized anxiety disorder: Secondary | ICD-10-CM | POA: Diagnosis not present

## 2023-03-23 MED ORDER — SERTRALINE HCL 50 MG PO TABS
50.0000 mg | ORAL_TABLET | Freq: Every day | ORAL | 1 refills | Status: DC
Start: 2023-03-23 — End: 2023-12-09
  Filled 2023-03-23: qty 90, 90d supply, fill #0
  Filled 2023-06-19: qty 90, 90d supply, fill #1
  Filled 2023-06-26: qty 90, 90d supply, fill #0

## 2023-03-23 NOTE — Progress Notes (Signed)
 Established Patient Office Visit     CC/Reason for Visit: Anxiety  HPI: Katelyn Hodges is a 58 y.o. female who is coming in today for the above mentioned reasons.  She is having significant workplace anxiety.  Stopped taking Lexapro couple months ago.  Has not yet started CBT.   Past Medical/Surgical History: Past Medical History:  Diagnosis Date   Anxiety    Atypical mole    Breast mass, right    Genital warts    GERD (gastroesophageal reflux disease)    Glaucoma    Headache(784.0)    Smoker    Ulcer     Past Surgical History:  Procedure Laterality Date   BREAST LUMPECTOMY  1998   BREAST LUMPECTOMY WITH RADIOACTIVE SEED LOCALIZATION Right 03/14/2019   Procedure: RIGHT BREAST LUMPECTOMY WITH RADIOACTIVE SEED LOCALIZATION;  Surgeon: Harriette Bouillon, MD;  Location: [facility];  Service: General;  Laterality: Right;   DIAGNOSTIC LAPAROSCOPY     LASER ABLATION CONDYLOMA CERVICAL / VULVAR     TONSILLECTOMY      Social History:  reports that she has been smoking cigarettes. She has a 30 pack-year smoking history. She has never used smokeless tobacco. She reports current alcohol use of about 6.0 standard drinks of alcohol per week. She reports that she does not use drugs.  Allergies: Allergies  Allergen Reactions   Bee Venom Anaphylaxis   Neomy-Bacit-Polymyx-Pramoxine    Bactroban [Mupirocin] Rash   Keflex [Cephalexin] Rash   Neomycin Rash    Family History:  Family History  Problem Relation Age of Onset   Breast cancer Mother 63   Cancer Mother        breast   Heart disease Father    Hyperlipidemia Father    Nephritis Sister    Heart disease Maternal Uncle    Cancer Maternal Uncle    Hyperlipidemia Maternal Uncle    Hypertension Maternal Uncle    Heart disease Paternal Uncle    Diabetes Maternal Grandmother    Heart disease Maternal Grandfather    Cancer Maternal Grandfather    Hyperlipidemia Maternal Grandfather    Hypertension  Maternal Grandfather    Throat cancer Maternal Grandfather    Lung cancer Maternal Grandfather    Heart disease Paternal Grandfather    Cancer Paternal Grandfather    Colon cancer Neg Hx    Colon polyps Neg Hx    Stomach cancer Neg Hx    Rectal cancer Neg Hx      Current Outpatient Medications:    sertraline (ZOLOFT) 50 MG tablet, Take 1 tablet (50 mg total) by mouth daily., Disp: 90 tablet, Rfl: 1  Review of Systems:  Negative unless indicated in HPI.   Physical Exam: Vitals:   03/23/23 1023  BP: 120/80  Pulse: 94  Temp: 98.2 F (36.8 C)  TempSrc: Oral  SpO2: 98%  Weight: 151 lb 1.6 oz (68.5 kg)    Body mass index is 23.32 kg/m.   Physical Exam Vitals reviewed.  Constitutional:      Appearance: Normal appearance.  HENT:     Head: Normocephalic and atraumatic.  Skin:    General: Skin is warm and dry.  Neurological:     General: No focal deficit present.     Mental Status: She is alert and oriented to person, place, and time.  Psychiatric:        Mood and Affect: Mood normal.        Behavior: Behavior normal.  Thought Content: Thought content normal.        Judgment: Judgment normal.     Flowsheet Row Office Visit from 03/23/2023 in [facility]  PHQ-9 Total Score 9         03/23/2023   10:31 AM 05/12/2022    7:09 AM  GAD 7 : Generalized Anxiety Score  Nervous, Anxious, on Edge 2 2  Control/stop worrying 2 1  Worry too much - different things 1 2  Trouble relaxing 2 1  Restless 1 1  Easily annoyed or irritable 3 3  Afraid - awful might happen 1 0  Total GAD 7 Score 12 10  Anxiety Difficulty Extremely difficult Somewhat difficult             Impression and Plan:  GAD (generalized anxiety disorder) -     Sertraline HCl; Take 1 tablet (50 mg total) by mouth daily.  Dispense: 90 tablet; Refill: 1   -High PHQ-9 and GAD-7 scores. -Start sertraline 50 mg daily.  Have strongly advised CBT.  Time spent:30  minutes reviewing chart, interviewing and examining patient and formulating plan of care.     Chaya Jan, MD [facility]

## 2023-04-26 ENCOUNTER — Encounter: Payer: Self-pay | Admitting: Internal Medicine

## 2023-04-26 DIAGNOSIS — Z1283 Encounter for screening for malignant neoplasm of skin: Secondary | ICD-10-CM

## 2023-05-03 ENCOUNTER — Ambulatory Visit: Admitting: Gastroenterology

## 2023-05-26 ENCOUNTER — Ambulatory Visit: Admitting: Physician Assistant

## 2023-06-19 ENCOUNTER — Other Ambulatory Visit (HOSPITAL_BASED_OUTPATIENT_CLINIC_OR_DEPARTMENT_OTHER): Payer: Self-pay

## 2023-06-23 ENCOUNTER — Ambulatory Visit: Admitting: Gastroenterology

## 2023-06-26 ENCOUNTER — Other Ambulatory Visit (HOSPITAL_COMMUNITY): Payer: Self-pay

## 2023-06-26 ENCOUNTER — Other Ambulatory Visit (HOSPITAL_BASED_OUTPATIENT_CLINIC_OR_DEPARTMENT_OTHER): Payer: Self-pay

## 2023-06-26 ENCOUNTER — Other Ambulatory Visit: Payer: Self-pay

## 2023-06-27 ENCOUNTER — Other Ambulatory Visit (HOSPITAL_BASED_OUTPATIENT_CLINIC_OR_DEPARTMENT_OTHER): Payer: Self-pay

## 2023-07-05 ENCOUNTER — Ambulatory Visit: Admitting: Dermatology

## 2023-07-14 ENCOUNTER — Ambulatory Visit: Payer: 59 | Admitting: Obstetrics and Gynecology

## 2023-07-28 ENCOUNTER — Other Ambulatory Visit: Payer: Self-pay | Admitting: Internal Medicine

## 2023-07-28 DIAGNOSIS — Z1231 Encounter for screening mammogram for malignant neoplasm of breast: Secondary | ICD-10-CM

## 2023-08-11 ENCOUNTER — Ambulatory Visit: Admitting: Obstetrics and Gynecology

## 2023-09-01 ENCOUNTER — Encounter: Payer: Self-pay | Admitting: Obstetrics and Gynecology

## 2023-09-01 ENCOUNTER — Ambulatory Visit (INDEPENDENT_AMBULATORY_CARE_PROVIDER_SITE_OTHER): Admitting: Obstetrics and Gynecology

## 2023-09-01 ENCOUNTER — Other Ambulatory Visit (HOSPITAL_COMMUNITY)
Admission: RE | Admit: 2023-09-01 | Discharge: 2023-09-01 | Disposition: A | Discharge status: Home / Self Care | Source: Ambulatory Visit | Attending: Obstetrics and Gynecology | Admitting: Obstetrics and Gynecology

## 2023-09-01 VITALS — BP 120/72 | HR 78 | Temp 98.1°F | Ht 68.5 in | Wt 153.0 lb

## 2023-09-01 DIAGNOSIS — N870 Mild cervical dysplasia: Secondary | ICD-10-CM | POA: Insufficient documentation

## 2023-09-01 DIAGNOSIS — Z124 Encounter for screening for malignant neoplasm of cervix: Secondary | ICD-10-CM | POA: Insufficient documentation

## 2023-09-01 NOTE — Progress Notes (Signed)
 58 y.o. G0P0000 female with CIN 1(cervical polyp 2024), ADH of right breast, VMS  here for 6 month interval PAP.  Patient reports a history of cervical warts treated in early adulthood with laser. She reports normal PAP since. Traveling to [place] (home) for the holidays.  Patient's last menstrual period was 07/06/2018.   Last PAP:    Component Value Date/Time   DIAGPAP (A) 12/30/2022 1006    - Atypical squamous cells of undetermined significance (ASC-US )   HPVHIGH Positive (A) 12/30/2022 1006   ADEQPAP  12/30/2022 1006    Satisfactory but limited for evaluation with partially obscuring   ADEQPAP organisms; transformation zone component present. 12/30/2022 1006   01/06/2023 pathology: A. CERVIX, 5:00, POLYP, BIOPSY:  Mild squamous dysplasia with HPV effect (LSIL, CIN-1)  Polypoid fragment of cervix showing marked acute and chronic cervicitis  with squamous metaplasia   B. ENDOCERVIX, CURETTAGE:  Mucoinflammatory and hemorrhagic debris with admixed benign endocervical epithelium and endocervix showing marked chronic endocervicitis  Detached fragments of benign ectocervical squamous epithelium   GYN HISTORY: History of cervical warts treated in early adulthood with laser ADH of right breast,  OB History  Gravida Para Term Preterm AB Living  0 0 0 0 0 0  SAB IAB Ectopic Multiple Live Births  0 0 0 0 0    Past Medical History:  Diagnosis Date   Anxiety    Atypical mole    Breast mass, right    Genital warts    GERD (gastroesophageal reflux disease)    Glaucoma    Headache(784.0)    Smoker    Ulcer     Past Surgical History:  Procedure Laterality Date   BREAST LUMPECTOMY  1998   BREAST LUMPECTOMY WITH RADIOACTIVE SEED LOCALIZATION Right 03/14/2019   Procedure: RIGHT BREAST LUMPECTOMY WITH RADIOACTIVE SEED LOCALIZATION;  Surgeon: Vanderbilt Ned, MD;  Location: [facility];  Service: General;  Laterality: Right;   DIAGNOSTIC LAPAROSCOPY     LASER  ABLATION CONDYLOMA CERVICAL / VULVAR     TONSILLECTOMY      Current Outpatient Medications on File Prior to Visit  Medication Sig Dispense Refill   sertraline  (ZOLOFT ) 50 MG tablet Take 1 tablet (50 mg total) by mouth daily. (Patient not taking: Reported on 09/01/2023) 90 tablet 1   No current facility-administered medications on file prior to visit.    Allergies  Allergen Reactions   Bee Venom Anaphylaxis   Neomy-Bacit-Polymyx-Pramoxine    Bactroban  [Mupirocin ] Rash   Keflex  [Cephalexin ] Rash   Neomycin Rash      PE Today's Vitals   09/01/23 0900  BP: 120/72  Pulse: 78  Temp: 98.1 F (36.7 C)  TempSrc: Oral  SpO2: 98%  Weight: 153 lb (69.4 kg)  Height: 5' 8.5 (1.74 m)    Body mass index is 22.93 kg/m.  Physical Exam Vitals reviewed. Exam conducted with a chaperone present.  Constitutional:      General: She is not in acute distress.    Appearance: Normal appearance.  HENT:     Head: Normocephalic and atraumatic.     Nose: Nose normal.  Eyes:     Extraocular Movements: Extraocular movements intact.     Conjunctiva/sclera: Conjunctivae normal.  Pulmonary:     Effort: Pulmonary effort is normal.  Genitourinary:    General: Normal vulva.     Exam position: Lithotomy position.     Vagina: Normal. No vaginal discharge.     Cervix: Normal. No cervical motion tenderness, discharge or  lesion.     Uterus: Normal. Not enlarged and not tender.      Adnexa: Right adnexa normal and left adnexa normal.  Musculoskeletal:        General: Normal range of motion.     Cervical back: Normal range of motion.  Neurological:     General: No focal deficit present.     Mental Status: She is alert.  Psychiatric:        Mood and Affect: Mood normal.        Behavior: Behavior normal.     Assessment and Plan:        Dysplasia of cervix, low grade (CIN 1) -     Cytology - PAP  Cervical cancer screening -     Cytology - PAP   RTO for annual  Vera LULLA Pa, MD

## 2023-09-06 ENCOUNTER — Ambulatory Visit: Payer: Self-pay | Admitting: Obstetrics and Gynecology

## 2023-09-06 LAB — CYTOLOGY - PAP
Comment: NEGATIVE
High risk HPV: NEGATIVE

## 2023-09-06 NOTE — Telephone Encounter (Signed)
 AEX scheduled 01/02/24.

## 2023-09-15 ENCOUNTER — Ambulatory Visit

## 2023-09-22 ENCOUNTER — Ambulatory Visit
Admission: RE | Admit: 2023-09-22 | Discharge: 2023-09-22 | Disposition: A | Discharge status: Home / Self Care | Source: Ambulatory Visit | Attending: Internal Medicine | Admitting: Internal Medicine

## 2023-09-22 DIAGNOSIS — Z1231 Encounter for screening mammogram for malignant neoplasm of breast: Secondary | ICD-10-CM | POA: Diagnosis not present

## 2023-10-26 ENCOUNTER — Ambulatory Visit: Admitting: Dermatology

## 2023-10-26 ENCOUNTER — Other Ambulatory Visit (HOSPITAL_BASED_OUTPATIENT_CLINIC_OR_DEPARTMENT_OTHER): Payer: Self-pay

## 2023-10-26 ENCOUNTER — Encounter: Payer: Self-pay | Admitting: Dermatology

## 2023-10-26 VITALS — BP 129/79 | HR 85

## 2023-10-26 DIAGNOSIS — W908XXA Exposure to other nonionizing radiation, initial encounter: Secondary | ICD-10-CM | POA: Diagnosis not present

## 2023-10-26 DIAGNOSIS — D225 Melanocytic nevi of trunk: Secondary | ICD-10-CM

## 2023-10-26 DIAGNOSIS — L821 Other seborrheic keratosis: Secondary | ICD-10-CM | POA: Diagnosis not present

## 2023-10-26 DIAGNOSIS — L570 Actinic keratosis: Secondary | ICD-10-CM | POA: Diagnosis not present

## 2023-10-26 DIAGNOSIS — L578 Other skin changes due to chronic exposure to nonionizing radiation: Secondary | ICD-10-CM

## 2023-10-26 DIAGNOSIS — Z1283 Encounter for screening for malignant neoplasm of skin: Secondary | ICD-10-CM | POA: Diagnosis not present

## 2023-10-26 DIAGNOSIS — D1801 Hemangioma of skin and subcutaneous tissue: Secondary | ICD-10-CM

## 2023-10-26 DIAGNOSIS — D485 Neoplasm of uncertain behavior of skin: Secondary | ICD-10-CM | POA: Diagnosis not present

## 2023-10-26 DIAGNOSIS — Z86018 Personal history of other benign neoplasm: Secondary | ICD-10-CM | POA: Diagnosis not present

## 2023-10-26 DIAGNOSIS — L814 Other melanin hyperpigmentation: Secondary | ICD-10-CM

## 2023-10-26 DIAGNOSIS — D239 Other benign neoplasm of skin, unspecified: Secondary | ICD-10-CM

## 2023-10-26 DIAGNOSIS — D229 Melanocytic nevi, unspecified: Secondary | ICD-10-CM

## 2023-10-26 MED ORDER — FLUOROURACIL 5 % EX CREA
TOPICAL_CREAM | Freq: Two times a day (BID) | CUTANEOUS | 2 refills | Status: AC
Start: 2023-10-26 — End: ?
  Filled 2023-10-26 – 2023-12-09 (×2): qty 40, 30d supply, fill #0

## 2023-10-26 NOTE — Progress Notes (Unsigned)
 New Patient Visit   Subjective  Katelyn Hodges is a 58 y.o. female who presents for the following: Skin Cancer Screening and Full Body Skin Exam  The patient presents for Total-Body Skin Exam (TBSE) for skin cancer screening and mole check. The patient has spots, moles and lesions to be evaluated, some may be new or changing.  The following portions of the chart were reviewed this encounter and updated as appropriate: medications, allergies, medical history  Review of Systems:  No other skin or systemic complaints except as noted in HPI or Assessment and Plan.  Objective  Well appearing patient in no apparent distress; mood and affect are within normal limits.  A full examination was performed including scalp, head, eyes, ears, nose, lips, neck, chest, axillae, abdomen, back, buttocks, bilateral upper extremities, bilateral lower extremities, hands, feet, fingers, toes, fingernails, and toenails. All findings within normal limits unless otherwise noted below.   Relevant physical exam findings are noted in the Assessment and Plan.  Left Upper Back 5mm pink papule   Left Breast, Right Breast Erythematous thin papules/macules with gritty scale.    Assessment & Plan   SKIN CANCER SCREENING PERFORMED TODAY.  ACTINIC DAMAGE - Chronic condition, secondary to cumulative UV/sun exposure - diffuse scaly erythematous macules with underlying dyspigmentation - Recommend daily broad spectrum sunscreen SPF 30+ to sun-exposed areas, reapply every 2 hours as needed.  - Staying in the shade or wearing long sleeves, sun glasses (UVA+UVB protection) and wide brim hats (4-inch brim around the entire circumference of the hat) are also recommended for sun protection.  - Call for new or changing lesions.  ACTINIC KERATOSIS Exam: Erythematous thin papules/macules with gritty scale at the chest  Actinic keratoses are precancerous spots that appear secondary to cumulative UV radiation exposure/sun  exposure over time. They are chronic with expected duration over 1 year. A portion of actinic keratoses will progress to squamous cell carcinoma of the skin. It is not possible to reliably predict which spots will progress to skin cancer and so treatment is recommended to prevent development of skin cancer.  Recommend daily broad spectrum sunscreen SPF 30+ to sun-exposed areas, reapply every 2 hours as needed.  Recommend staying in the shade or wearing long sleeves, sun glasses (UVA+UVB protection) and wide brim hats (4-inch brim around the entire circumference of the hat). Call for new or changing lesions.  Treatment Plan: Start 5-fluorouracil cream twice a day for 14 days to affected areas including chest.  Reviewed course of treatment and expected reaction.  Patient advised to expect inflammation and crusting and advised that erosions are possible.  Patient advised to be diligent with sun protection during and after treatment. Handout with details of how to apply medication and what to expect provided. Counseled to keep medication out of reach of children and pets.  Reviewed course of treatment and expected reaction.  Patient advised to expect inflammation and crusting and advised that erosions are possible.  Patient advised to be diligent with sun protection during and after treatment. Handout with details of how to apply medication and what to expect provided. Counseled to keep medication out of reach of children and pets.   LENTIGINES, SEBORRHEIC KERATOSES, HEMANGIOMAS - Benign normal skin lesions - Benign-appearing - Call for any changes  MELANOCYTIC NEVI - Tan-brown and/or pink-flesh-colored symmetric macules and papules - Benign appearing on exam today - Observation - Call clinic for new or changing moles - Recommend daily use of broad spectrum spf 30+ sunscreen to  sun-exposed areas.   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  Call if any new or changing lesions are noted between office visits   NEOPLASM OF UNCERTAIN BEHAVIOR OF SKIN Left Upper Back Skin / nail biopsy Type of biopsy: tangential   Informed consent: discussed and consent obtained   Timeout: patient name, date of birth, surgical site, and procedure verified   Procedure prep:  Patient was prepped and draped in usual sterile fashion Prep type:  Isopropyl alcohol Anesthesia: the lesion was anesthetized in a standard fashion   Anesthetic:  1% lidocaine  w/ epinephrine  1-100,000 buffered w/ 8.4% NaHCO3 Instrument used: DermaBlade   Hemostasis achieved with: aluminum chloride   Outcome: patient tolerated procedure well   Post-procedure details: sterile dressing applied and wound care instructions given   Dressing type: petrolatum gauze and bandage    Specimen 1 - Surgical pathology Differential Diagnosis: r/o NMSC vs other  Check Margins: No AK (ACTINIC KERATOSIS) (2) Left Breast, Right Breast fluorouracil (EFUDEX) 5 % cream - Left Breast, Right Breast Apply topically 2 (two) times daily.   Return in about 6 months (around 04/25/2024) for efudex f/u .  I, Berwyn Lesches, Surg Tech III, am acting as scribe for RUFUS CHRISTELLA HOLY, MD.   Documentation: I have reviewed the above documentation for accuracy and completeness, and I agree with the above.  RUFUS CHRISTELLA HOLY, MD

## 2023-10-26 NOTE — Patient Instructions (Signed)
 Important Informati Patient Handout: Wound Care for Skin Biopsy Site  Taking Care of Your Skin Biopsy Site  Proper care of the biopsy site is essential for promoting healing and minimizing scarring. This handout provides instructions on how to care for your biopsy site to ensure optimal recovery.  1. Cleaning the Wound:  Clean the biopsy site daily with gentle soap and water. Gently pat the area dry with a clean, soft towel. Avoid harsh scrubbing or rubbing the area, as this can irritate the skin and delay healing.  2. Applying Aquaphor and Bandage:  After cleaning the wound, apply a thin layer of Aquaphor ointment to the biopsy site. Cover the area with a sterile bandage to protect it from dirt, bacteria, and friction. Change the bandage daily or as needed if it becomes soiled or wet.  3. Continued Care for One Week:  Repeat the cleaning, Aquaphor application, and bandaging process daily for one week following the biopsy procedure. Keeping the wound clean and moist during this initial healing period will help prevent infection and promote optimal healing.  4. Massaging Aquaphor into the Area:  ---After one week, discontinue the use of bandages but continue to apply Aquaphor to the biopsy site. ----Gently massage the Aquaphor into the area using circular motions. ---Massaging the skin helps to promote circulation and prevent the formation of scar tissue.   Additional Tips:  Avoid exposing the biopsy site to direct sunlight during the healing process, as this can cause hyperpigmentation or worsen scarring. If you experience any signs of infection, such as increased redness, swelling, warmth, or drainage from the wound, contact your healthcare provider immediately. Follow any additional instructions provided by your healthcare provider for caring for the biopsy site and managing any discomfort. Conclusion:  Taking proper care of your skin biopsy site is crucial for ensuring  optimal healing and minimizing scarring. By following these instructions for cleaning, applying Aquaphor, and massaging the area, you can promote a smooth and successful recovery. If you have any questions or concerns about caring for your biopsy site, don't hesitate to contact your healthcare provider for guidance.  on  Due to recent changes in healthcare laws, you may see results of your pathology and/or laboratory studies on MyChart before the doctors have had a chance to review them. We understand that in some cases there may be results that are confusing or concerning to you. Please understand that not all results are received at the same time and often the doctors may need to interpret multiple results in order to provide you with the best plan of care or course of treatment. Therefore, we ask that you please give us  2 business days to thoroughly review all your results before contacting the office for clarification. Should we see a critical lab result, you will be contacted sooner.   If You Need Anything After Your Visit  If you have any questions or concerns for your doctor, please call our main line at [phone] If no one answers, please leave a voicemail as directed and we will return your call as soon as possible. Messages left after 4 pm will be answered the following business day.   You may also send us  a message via MyChart. We typically respond to MyChart messages within 1-2 business days.  For prescription refills, please ask your pharmacy to contact our office. Our fax number is [phone].  If you have an urgent issue when the clinic is closed that cannot wait until the next business day,  you can page your doctor at the number below.    Please note that while we do our best to be available for urgent issues outside of office hours, we are not available 24/7.   If you have an urgent issue and are unable to reach us , you may choose to seek medical care at your doctor's office,  retail clinic, urgent care center, or emergency room.  If you have a medical emergency, please immediately call 911 or go to the emergency department. In the event of inclement weather, please call our main line at [phone] for an update on the status of any delays or closures.  Dermatology Medication Tips: Please keep the boxes that topical medications come in in order to help keep track of the instructions about where and how to use these. Pharmacies typically print the medication instructions only on the boxes and not directly on the medication tubes.   If your medication is too expensive, please contact our office at [phone] or send us  a message through MyChart.   We are unable to tell what your co-pay for medications will be in advance as this is different depending on your insurance coverage. However, we may be able to find a substitute medication at lower cost or fill out paperwork to get insurance to cover a needed medication.   If a prior authorization is required to get your medication covered by your insurance company, please allow us  1-2 business days to complete this process.  Drug prices often vary depending on where the prescription is filled and some pharmacies may offer cheaper prices.  The website www.goodrx.com contains coupons for medications through different pharmacies. The prices here do not account for what the cost may be with help from insurance (it may be cheaper with your insurance), but the website can give you the price if you did not use any insurance.  - You can print the associated coupon and take it with your prescription to the pharmacy.  - You may also stop by our office during regular business hours and pick up a GoodRx coupon card.  - If you need your prescription sent electronically to a different pharmacy, notify our office through [facility] or by phone at [phone]    Skin Education :   I counseled the patient regarding the  following: Sun screen (SPF 30 or greater) should be applied during peak UV exposure (between 10am and 2pm) and reapplied after exercise or swimming.  The ABCDEs of melanoma were reviewed with the patient, and the importance of monthly self-examination of moles was emphasized. Should any moles change in shape or color, or itch, bleed or burn, pt will contact our office for evaluation sooner then their interval appointment.  Plan: Sunscreen Recommendations I recommended a broad spectrum sunscreen with a SPF of 30 or higher. I explained that SPF 30 sunscreens block approximately 97 percent of the sun's harmful rays. Sunscreens should be applied at least 15 minutes prior to expected sun exposure and then every 2 hours after that as long as sun exposure continues. If swimming or exercising sunscreen should be reapplied every 45 minutes to an hour after getting wet or sweating. One ounce, or the equivalent of a shot glass full of sunscreen, is adequate to protect the skin not covered by a bathing suit. I also recommended a lip balm with a sunscreen as well. Sun protective clothing can be used in lieu of sunscreen but must be worn the entire time you are exposed  to the sun's rays.

## 2023-10-26 NOTE — Progress Notes (Unsigned)
   New Patient Visit   Subjective  Katelyn Hodges is a 58 y.o. female who presents for the following: Skin Cancer Screening and Full Body Skin Exam  The patient presents for Total-Body Skin Exam (TBSE) for skin cancer screening and mole check. The patient has spots, moles and lesions to be evaluated, some may be new or changing and the patient may have concern these could be cancer.    The following portions of the chart were reviewed this encounter and updated as appropriate: medications, allergies, medical history  Review of Systems:  No other skin or systemic complaints except as noted in HPI or Assessment and Plan.  Objective  Well appearing patient in no apparent distress; mood and affect are within normal limits.  A full examination was performed including scalp, head, eyes, ears, nose, lips, neck, chest, axillae, abdomen, back, buttocks, bilateral upper extremities, bilateral lower extremities, hands, feet, fingers, toes, fingernails, and toenails. All findings within normal limits unless otherwise noted below.   Relevant physical exam findings are noted in the Assessment and Plan.    Assessment & Plan   SKIN CANCER SCREENING PERFORMED TODAY.  ACTINIC DAMAGE - Chronic condition, secondary to cumulative UV/sun exposure - diffuse scaly erythematous macules with underlying dyspigmentation - Recommend daily broad spectrum sunscreen SPF 30+ to sun-exposed areas, reapply every 2 hours as needed.  - Staying in the shade or wearing long sleeves, sun glasses (UVA+UVB protection) and wide brim hats (4-inch brim around the entire circumference of the hat) are also recommended for sun protection.  - Call for new or changing lesions.  LENTIGINES, SEBORRHEIC KERATOSES, HEMANGIOMAS - Benign normal skin lesions - Benign-appearing - Call for any changes  MELANOCYTIC NEVI - Tan-brown and/or pink-flesh-colored symmetric macules and papules - Benign appearing on exam today -  Observation - Call clinic for new or changing moles - Recommend daily use of broad spectrum spf 30+ sunscreen to sun-exposed areas.          No follow-ups on file.  I, Berwyn Lesches, Surg Tech III, am acting as scribe for RUFUS CHRISTELLA HOLY, MD.   Documentation: I have reviewed the above documentation for accuracy and completeness, and I agree with the above.  RUFUS CHRISTELLA HOLY, MD

## 2023-10-27 LAB — SURGICAL PATHOLOGY

## 2023-10-30 ENCOUNTER — Ambulatory Visit: Payer: Self-pay | Admitting: Dermatology

## 2023-11-06 ENCOUNTER — Other Ambulatory Visit (HOSPITAL_BASED_OUTPATIENT_CLINIC_OR_DEPARTMENT_OTHER): Payer: Self-pay

## 2023-12-09 ENCOUNTER — Other Ambulatory Visit: Payer: Self-pay | Admitting: Internal Medicine

## 2023-12-09 DIAGNOSIS — F411 Generalized anxiety disorder: Secondary | ICD-10-CM

## 2023-12-11 ENCOUNTER — Other Ambulatory Visit (HOSPITAL_COMMUNITY): Payer: Self-pay

## 2023-12-11 ENCOUNTER — Other Ambulatory Visit: Payer: Self-pay | Admitting: Internal Medicine

## 2023-12-11 ENCOUNTER — Other Ambulatory Visit: Payer: Self-pay

## 2023-12-11 DIAGNOSIS — F411 Generalized anxiety disorder: Secondary | ICD-10-CM

## 2023-12-11 MED ORDER — SERTRALINE HCL 50 MG PO TABS
50.0000 mg | ORAL_TABLET | Freq: Every day | ORAL | 0 refills | Status: AC
  Filled 2023-12-11: qty 90, 90d supply, fill #0

## 2023-12-12 ENCOUNTER — Other Ambulatory Visit (HOSPITAL_COMMUNITY): Payer: Self-pay

## 2024-01-02 ENCOUNTER — Ambulatory Visit: Admitting: Obstetrics and Gynecology

## 2024-02-11 IMAGING — DX DG ANKLE COMPLETE 3+V*R*
3 series · 3 of 3 positions shown · non-contrast
Comparison: None Available.

CLINICAL DATA: Acute RIGHT ankle pain following fall. Initial
encounter.

EXAM:
RIGHT ANKLE - COMPLETE 3+ VIEW

[ankle ap]
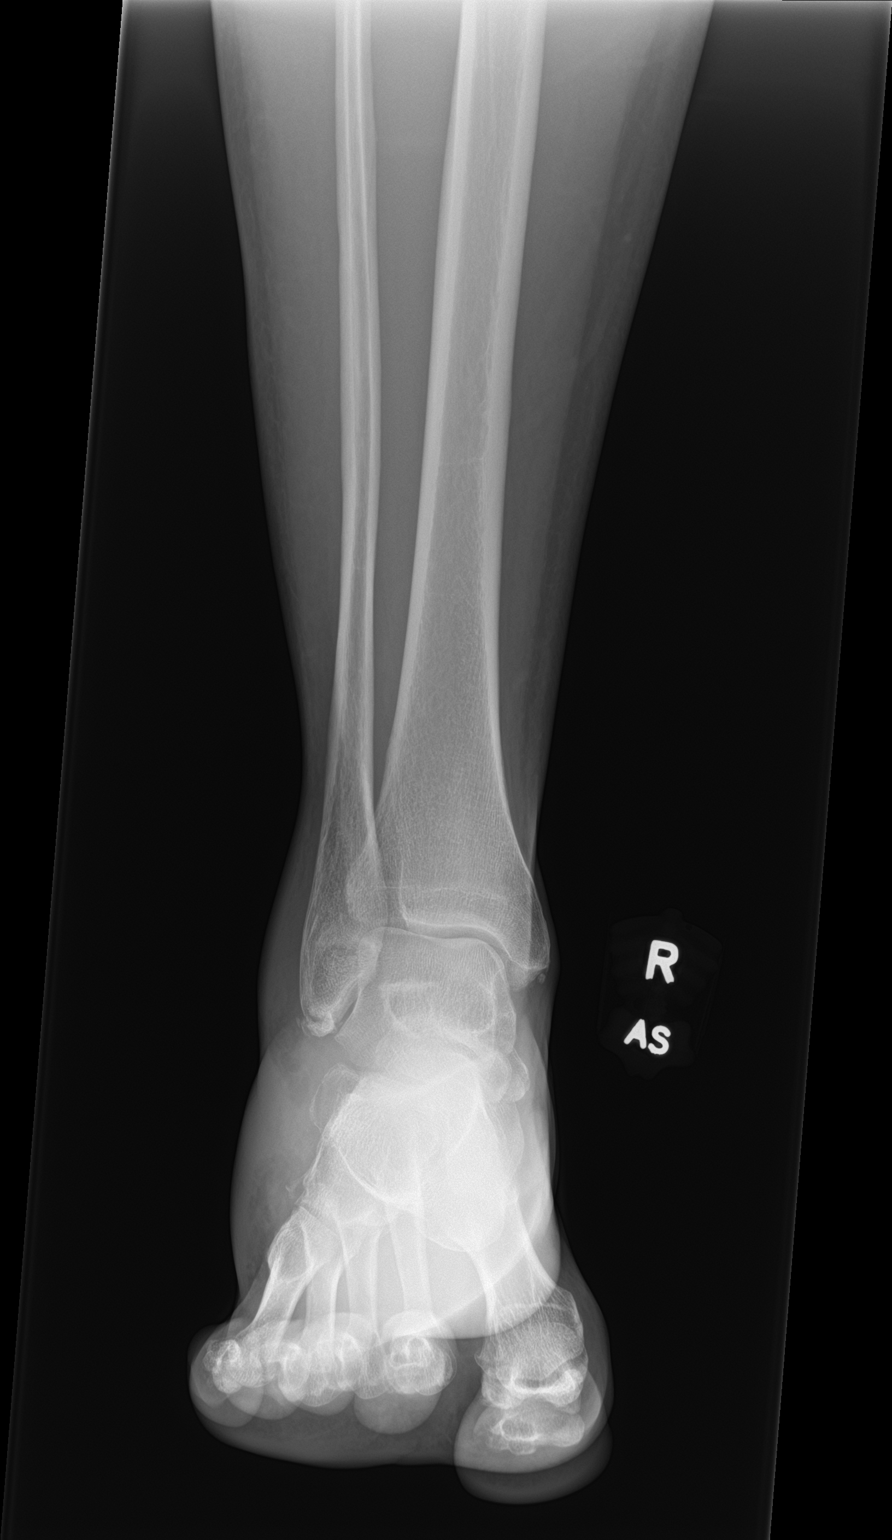

[ankle obl]
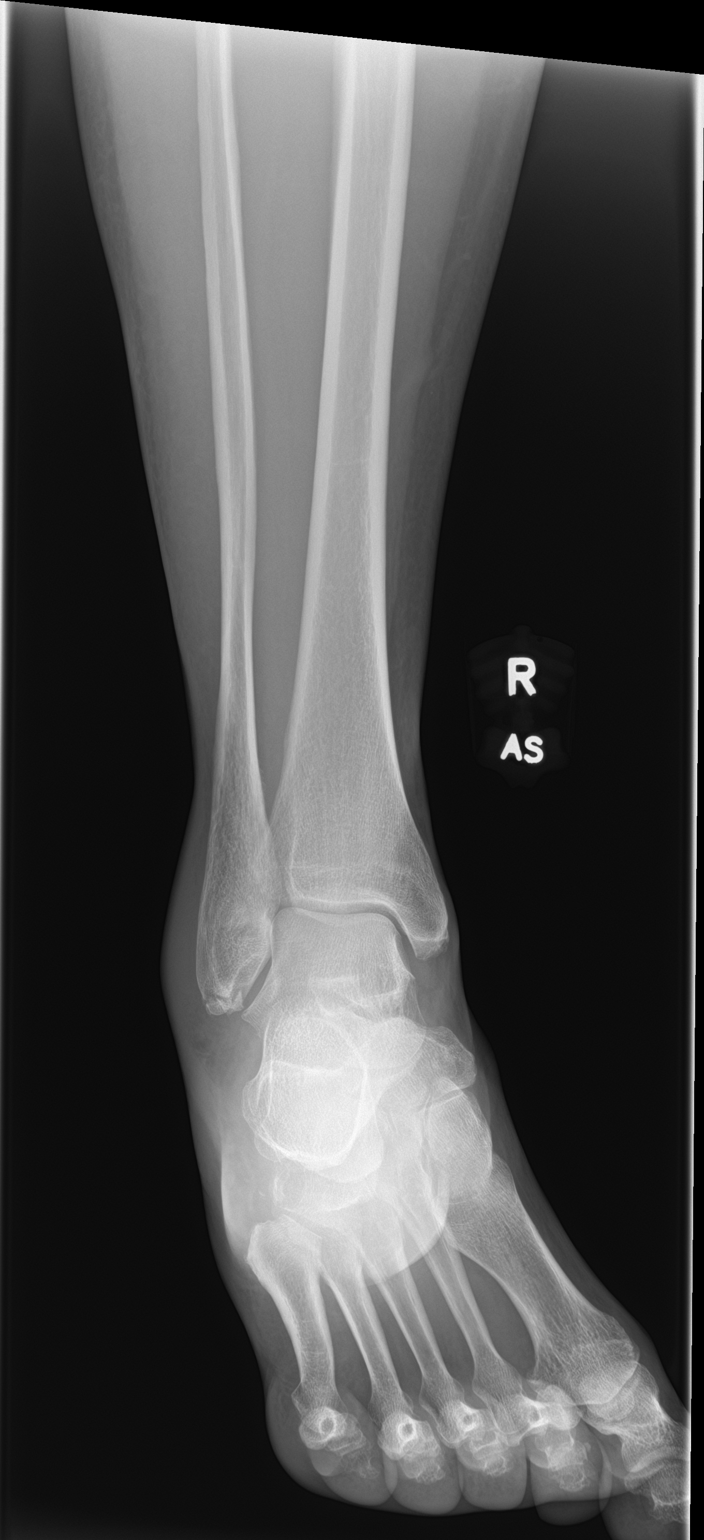

[ankle lat]
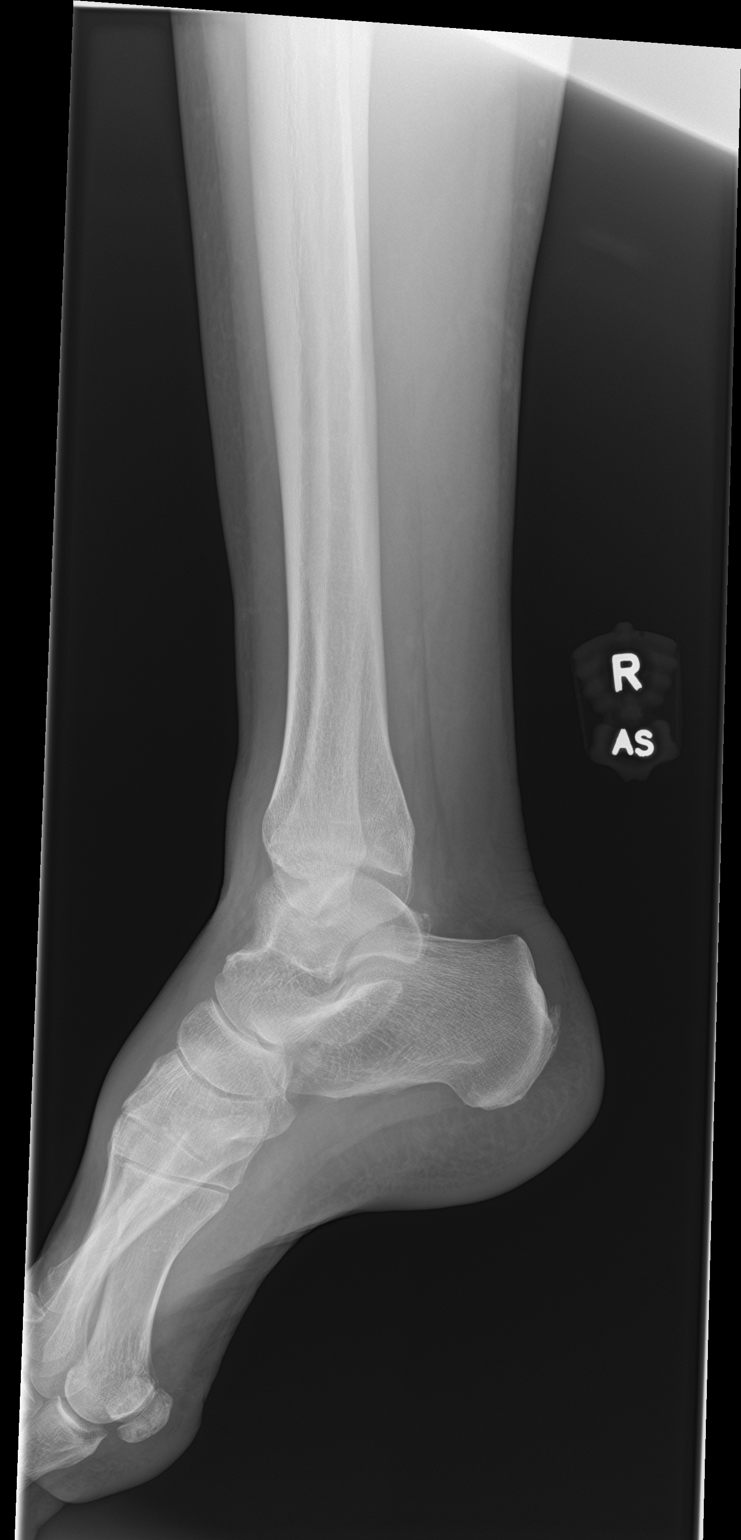

[3 of 3 positions shown; findings below may reference images not displayed]

FINDINGS: A fibular tip fracture is noted.

Small avulsion fractures along the LATERAL calcaneus noted.

No other fracture, subluxation or dislocation identified.

Soft tissue swelling is noted.
IMPRESSION: Fibular tip fracture and small avulsion fracture of the LATERAL
calcaneus. No dislocation.

## 2024-02-19 ENCOUNTER — Ambulatory Visit: Admitting: Obstetrics and Gynecology

## 2024-04-25 ENCOUNTER — Ambulatory Visit: Admitting: Dermatology

## 2024-05-10 ENCOUNTER — Ambulatory Visit: Admitting: Obstetrics and Gynecology
